# Patient Record
Sex: Female | Born: 1970 | Race: White | Hispanic: No | Marital: Married | Smoking: Never smoker
Health system: Southern US, Community
[De-identification: ages and names within clinical notes are randomized; demographics above are authoritative.]

## PROBLEM LIST (undated history)

## (undated) DIAGNOSIS — H9190 Unspecified hearing loss, unspecified ear: Secondary | ICD-10-CM

## (undated) DIAGNOSIS — Z872 Personal history of diseases of the skin and subcutaneous tissue: Secondary | ICD-10-CM

## (undated) DIAGNOSIS — Z87442 Personal history of urinary calculi: Secondary | ICD-10-CM

## (undated) DIAGNOSIS — Z8489 Family history of other specified conditions: Secondary | ICD-10-CM

## (undated) DIAGNOSIS — D649 Anemia, unspecified: Secondary | ICD-10-CM

## (undated) DIAGNOSIS — R51 Headache: Secondary | ICD-10-CM

## (undated) DIAGNOSIS — Z973 Presence of spectacles and contact lenses: Secondary | ICD-10-CM

## (undated) DIAGNOSIS — I499 Cardiac arrhythmia, unspecified: Secondary | ICD-10-CM

## (undated) DIAGNOSIS — M26609 Unspecified temporomandibular joint disorder, unspecified side: Secondary | ICD-10-CM

## (undated) DIAGNOSIS — Z8742 Personal history of other diseases of the female genital tract: Secondary | ICD-10-CM

## (undated) DIAGNOSIS — Z98811 Dental restoration status: Secondary | ICD-10-CM

## (undated) HISTORY — PX: APPENDECTOMY: SHX54

## (undated) HISTORY — PX: CYSTOSCOPY WITH RETROGRADE PYELOGRAM, URETEROSCOPY AND STENT PLACEMENT: SHX5789

## (undated) HISTORY — PX: CYSTOSCOPY W/ URETERAL STENT REMOVAL: SHX1430

## (undated) HISTORY — PX: WISDOM TOOTH EXTRACTION: SHX21

## (undated) HISTORY — PX: COLONOSCOPY: SHX174

---

## 1998-06-02 ENCOUNTER — Other Ambulatory Visit: Admission: RE | Admit: 1998-06-02 | Discharge: 1998-06-02 | Payer: Self-pay | Admitting: Obstetrics and Gynecology

## 1999-06-25 ENCOUNTER — Other Ambulatory Visit: Admission: RE | Admit: 1999-06-25 | Discharge: 1999-06-25 | Payer: Self-pay | Admitting: Obstetrics and Gynecology

## 2000-05-21 ENCOUNTER — Inpatient Hospital Stay (HOSPITAL_COMMUNITY): Admission: AD | Admit: 2000-05-21 | Discharge: 2000-05-21 | Payer: Self-pay | Admitting: Obstetrics and Gynecology

## 2000-05-22 ENCOUNTER — Inpatient Hospital Stay (HOSPITAL_COMMUNITY): Admission: AD | Admit: 2000-05-22 | Discharge: 2000-05-22 | Payer: Self-pay | Admitting: Obstetrics and Gynecology

## 2000-06-05 ENCOUNTER — Observation Stay (HOSPITAL_COMMUNITY): Admission: AD | Admit: 2000-06-05 | Discharge: 2000-06-06 | Payer: Self-pay | Admitting: Obstetrics and Gynecology

## 2000-06-05 ENCOUNTER — Encounter: Payer: Self-pay | Admitting: Obstetrics and Gynecology

## 2000-06-24 ENCOUNTER — Inpatient Hospital Stay (HOSPITAL_COMMUNITY): Admission: AD | Admit: 2000-06-24 | Discharge: 2000-06-24 | Payer: Self-pay | Admitting: Obstetrics and Gynecology

## 2000-06-29 ENCOUNTER — Inpatient Hospital Stay (HOSPITAL_COMMUNITY): Admission: AD | Admit: 2000-06-29 | Discharge: 2000-07-02 | Payer: Self-pay | Admitting: Obstetrics and Gynecology

## 2000-06-30 ENCOUNTER — Encounter: Payer: Self-pay | Admitting: Obstetrics and Gynecology

## 2000-07-05 ENCOUNTER — Ambulatory Visit (HOSPITAL_COMMUNITY): Admission: RE | Admit: 2000-07-05 | Discharge: 2000-07-05 | Payer: Self-pay | Admitting: Obstetrics and Gynecology

## 2000-07-07 ENCOUNTER — Inpatient Hospital Stay (HOSPITAL_COMMUNITY): Admission: AD | Admit: 2000-07-07 | Discharge: 2000-07-11 | Payer: Self-pay | Admitting: Obstetrics and Gynecology

## 2000-07-12 ENCOUNTER — Encounter: Admission: RE | Admit: 2000-07-12 | Discharge: 2000-08-11 | Payer: Self-pay | Admitting: Obstetrics and Gynecology

## 2000-08-09 ENCOUNTER — Other Ambulatory Visit: Admission: RE | Admit: 2000-08-09 | Discharge: 2000-08-09 | Payer: Self-pay | Admitting: Obstetrics and Gynecology

## 2000-10-24 ENCOUNTER — Encounter: Payer: Self-pay | Admitting: *Deleted

## 2000-10-24 ENCOUNTER — Ambulatory Visit (HOSPITAL_COMMUNITY): Admission: RE | Admit: 2000-10-24 | Discharge: 2000-10-24 | Payer: Self-pay | Admitting: *Deleted

## 2000-11-28 ENCOUNTER — Ambulatory Visit (HOSPITAL_COMMUNITY): Admission: RE | Admit: 2000-11-28 | Discharge: 2000-11-28 | Payer: Self-pay | Admitting: *Deleted

## 2000-11-28 ENCOUNTER — Encounter: Payer: Self-pay | Admitting: *Deleted

## 2001-02-05 ENCOUNTER — Inpatient Hospital Stay (HOSPITAL_COMMUNITY): Admission: AD | Admit: 2001-02-05 | Discharge: 2001-02-05 | Payer: Self-pay | Admitting: Obstetrics and Gynecology

## 2001-02-05 ENCOUNTER — Encounter: Payer: Self-pay | Admitting: Obstetrics and Gynecology

## 2001-09-07 ENCOUNTER — Other Ambulatory Visit: Admission: RE | Admit: 2001-09-07 | Discharge: 2001-09-07 | Payer: Self-pay | Admitting: Obstetrics and Gynecology

## 2002-10-31 ENCOUNTER — Other Ambulatory Visit: Admission: RE | Admit: 2002-10-31 | Discharge: 2002-10-31 | Payer: Self-pay | Admitting: Obstetrics and Gynecology

## 2003-12-18 ENCOUNTER — Other Ambulatory Visit: Admission: RE | Admit: 2003-12-18 | Discharge: 2003-12-18 | Payer: Self-pay | Admitting: Obstetrics and Gynecology

## 2005-01-21 ENCOUNTER — Other Ambulatory Visit: Admission: RE | Admit: 2005-01-21 | Discharge: 2005-01-21 | Payer: Self-pay | Admitting: Obstetrics and Gynecology

## 2005-06-16 ENCOUNTER — Encounter: Admission: RE | Admit: 2005-06-16 | Discharge: 2005-06-16 | Payer: Self-pay | Admitting: Gastroenterology

## 2006-10-05 ENCOUNTER — Ambulatory Visit (HOSPITAL_COMMUNITY): Admission: RE | Admit: 2006-10-05 | Discharge: 2006-10-05 | Payer: Self-pay | Admitting: Obstetrics and Gynecology

## 2006-11-02 ENCOUNTER — Inpatient Hospital Stay (HOSPITAL_COMMUNITY): Admission: RE | Admit: 2006-11-02 | Discharge: 2006-11-06 | Payer: Self-pay | Admitting: Obstetrics and Gynecology

## 2009-04-24 ENCOUNTER — Ambulatory Visit (HOSPITAL_COMMUNITY): Admission: RE | Admit: 2009-04-24 | Discharge: 2009-04-24 | Payer: Self-pay | Admitting: Obstetrics and Gynecology

## 2010-03-31 ENCOUNTER — Encounter
Admission: RE | Admit: 2010-03-31 | Discharge: 2010-03-31 | Payer: Self-pay | Source: Home / Self Care | Attending: Otolaryngology | Admitting: Otolaryngology

## 2010-08-21 NOTE — H&P (Signed)
NAME:  Terri Mora, Terri Mora NO.:  [id number]   MEDICAL RECORD NO.:  [id number]          PATIENT TYPE:  INP   LOCATION:  9107                          FACILITY:  [facility]   PHYSICIAN:  Juluis Mire, M.D.   DATE OF BIRTH:  07-23-1970   DATE OF ADMISSION:  11/02/2006  DATE OF DISCHARGE:                              HISTORY & PHYSICAL   The patient is a 40 year old gravida 4, para 2, abortus 1 female whose  last menstrual period was February 13, 2006 gives her estimated date of  confinement of August 19 and estimated gestational age of approximately  37 weeks.  The patient's prenatal course has been complicated by two  prior low transverse cesarean sections.  She is for repeat cesarean  section.  She has had elevated mean arterial pressures for the past  several weeks.  We have been watching her closely. We did amniocentesis  yesterday revealed an LS of 2.8:1 with phosphatidylglycerol.  In view of  this we will proceed with the repeat cesarean section at the present  time.   She is also risk for advanced maternal age.  She declined any type of  genetic evaluation stating she would not terminate the pregnancy.   The patient's pregnancy was also complicated by preterm uterine  activity.  She was using Procardia as needed.  We will watch her closely  with fetal fibronectin and cervical checks.   She also has a history of elevated liver function tests. She had a  complete work up at [facility] with negative findings, she has a mildly  elevated SGOT that is consistent issue.   The patient's baby also developed PAC's.  She had complete screen by the  maternal fetal medicine department at [facility], cardiac  evaluation was normal. At that time there were no PAC is noted.   Again she had amniocentesis performed yesterday with mature studies.  That time, biophysical profiles 8/8 and she had a reactive nonstress  test.   ALLERGIES:  She is allergic to IVP DYE.   MEDICATIONS:  Include prenatal vitamins and Procardia.   PAST MEDICAL HISTORY.:   FAMILY HISTORY AND SOCIAL HISTORY:  Please see prenatal records.   REVIEW OF SYSTEMS:  Noncontributory.   PHYSICAL EXAMINATION:  VITAL SIGNS:  The patient is afebrile with stable  vital signs.  HEENT: The patient normocephalic.  Pupils equal and reactive to light  and accommodation.  Extraocular movements were intact.  Sclerae and  conjunctivae are clear.  Oropharynx clear, neck without thyromegaly.  BREASTS:  Not examined.  LUNGS:  Clear.  HEART:  Regular rhythm and rate with a grade 2/6 systolic ejection  murmur.  No clicks or gallops.  ABDOMEN:  Gravid uterus consistent with dates.  PELVIC: Exam deferred.  EXTREMITIES:  Trace edema.  NEUROLOGY:  Grossly within normal limits.  Deep tendon reflexes 2+ and  no clonus.   IMPRESSION:  1. Intrauterine pregnancy at 37 weeks with mature fetal studies.  2. Mild pregnancy-induced hypertension.  3. Chronically elevated SGOT.  4. Infant with occasional episodes of PAC's.   PLAN:  The patient will undergo repeat  cesarean section.  The risks have  been explained including the risk of infection.  The risk of hemorrhage  that could require transfusion with the risk of AIDS or hepatitis.  The  risk of injury to adjacent organs including bladder, bowel, ureters.  The risk of deep venous thrombosis and pulmonary embolus.  The patient  expressed understanding of indications and risks.      Juluis Mire, M.D.  Electronically Signed     JSM/MEDQ  D:  11/02/2006  T:  11/02/2006  Job:  [id number]

## 2010-08-21 NOTE — Discharge Summary (Signed)
Terri Mora, Terri Mora           ACCOUNT NO.:  [id number]   MEDICAL RECORD NO.:  [id number]          PATIENT TYPE:  INP   LOCATION:  9107                          [facility]ACILITY:  [facility]   PHYSICIAN:  Michelle L. Grewal, M.D.DATE OF BIRTH:  [birth year]   DATE OF ADMISSION:  11/02/2006  DATE OF DISCHARGE:  11/06/2006                               DISCHARGE SUMMARY   ADMITTING DIAGNOSIS:  1. Intrauterine pregnancy at 37 weeks estimated gestational age.  2. Mild pregnancy-induced hypertension.  3. Chronically elevated SGOT.  4. Previous cesarean section, desires repeat.   DISCHARGE DIAGNOSIS:  1. Status post low transverse cesarean section.  2. Viable female infant.   PROCEDURE:  Repeat low transverse cesarean section   REASON FOR ADMISSION:  Please see dictated H&P.   HOSPITAL COURSE:  The patient was a 40 year old gravida 4, para 2,  abortus 1 that was admitted to [facility] for scheduled  cesarean section.  The patient was noted to have some elevated mean  arterial pressures over the past several weeks and she had been observed  closely.  The patient did undergo amniocentesis which had revealed a  mature LS ratio of 2.8:1 with PG.  The patient also had a history of  elevated liver function test.  She had undergone a complete workup at  [facility] with negative findings.  This was of mild elevation in SGOT  that was persistent.  On the morning of admission the patient was taken  to the operating room where spinal anesthesia was administered without  difficulty.  A low transverse incision was made with delivery of a  viable female infant weighing 7 pounds 3 ounces, Apgars of 8 at 1 minute  and 9 at 5 minutes.  The patient tolerated procedure well and was taken  to the recovery room in stable condition.  On postoperative day #1 the  patient was without complaint.  Vital signs were stable.  She is  afebrile.  Abdomen soft with good return of bowel function.  Fundus is  firm  and nontender.  Abdominal dressing had been reinforced.  Dressing  was partially removed and no active bleeding was noted.  Foley was noted  to have adequate urine output.  Laboratory findings revealed hemoglobin  of 10.1, platelet count 175,000, WBC count of 10.3.  Blood type was  noted to be A+.  On postoperative day #2 the patient complained of some  flatus.  Vital signs were stable.  She is afebrile.  Abdomen soft.  Fundus firm and nontender.  Incision was clean, dry and intact.  She was  ambulating well.  On postoperative day #3 the patient was without  complaint.  Vital signs were stable.  She is afebrile.  Fundus firm and  nontender.  Incision was clean, dry and intact.  The patient desired to  stay one additional day.  On postoperative day #4 the patient was  without complaint.  Vital signs stable.  She is afebrile.  Fundus firm  and nontender.  Incision was clean, dry and intact.  Staples were  removed and the patient was later discharged home.   CONDITION  ON DISCHARGE:  Good.   DIET:  Regular as tolerated.   ACTIVITY:  No heavy lifting, no driving x2 weeks.  No vaginal entry.   FOLLOW UP:  Patient to follow up in the office in 1-2 weeks for incision  check.  She is to call for temperature greater than 100 degrees,  persistent nausea, vomiting, heavy vaginal bleeding and/or redness or  drainage from the incisional site.   DISCHARGE MEDICATION:  1. Tylox #30 one p.o. every 4 to 6 hours.  2. Motrin 600 mg every 6 hours.  3. Prenatal vitamins one p.o. daily.  4. Colace one p.o. daily p.r.n.      Julio Sicks, N.P.      Terri Mora, M.D.  Electronically Signed    CC/MEDQ  D:  12/07/2006  T:  12/07/2006  Job:  [id number]

## 2010-08-21 NOTE — H&P (Signed)
[facility]  Patient:    Terri Mora, Terri Mora                  MRN: [id number] Adm. Date:  [id number] Disc. Date: [id number] Attending:  Trevor Iha                         History and Physical  HISTORY OF PRESENT ILLNESS:   The patient is a 40 year old gravida 2 para 0-1-0-1 married white female, last menstrual period of July 21 giving her an estimated date of confinement of May 1, estimated gestational age of 36+ weeks.  This is consistent with initial exam done early in the pregnancy and prior early ultrasound.  She presents now for repeat cesarean section.  Patients first pregnancy ended up with a low transverse cesarean section for questionable fetal abruption.  She was offered the option of trial of labor, which she has declined.  Her pregnancy was also complicated by preterm labor for which she has been on terbutaline and required prior hospitalization. Because of continued uterine activity and discomfort, we did an amniocentesis on her on April 2.  L/S was 3.3:1 with PG. She decided to proceed with repeat cesarean section and in view of the mature pulmonary status presents at the present time for this.  Again, the trial of labor has been offered.  ALLERGIES:                    IVP DYE.  MEDICATIONS:                  Prenatal vitamins.  She had been on terbutaline.  PRENATAL LABORATORY:          Patient is A positive, negative antibody screen. Nonreactive serology.  Equivocal rubella.  Negative hepatitis B surface antigen.  GC and chlamydia were both negative.  Her plasma 50 g Glucola was 93, which was normal.  PAST MEDICAL HISTORY, FAMILY HISTORY, SOCIAL HISTORY:      Please see prenatal records.  REVIEW OF SYSTEMS:            Noncontributory.  PHYSICAL EXAMINATION:  VITAL SIGNS:                  Patient is afebrile with stable vital signs.  HEENT:                        Patient is normocephalic.  Pupils equal, round, and  reactive to light and accommodation.  Extraocular movements are intact. Sclerae and conjunctivae were clear.  Oropharynx clear.  NECK:                         Without thyromegaly.  LUNGS:                        Clear.  CARDIOVASCULAR:               Regular rhythm and rate with a grade 2/6 systolic ejection murmur.  No clicks or gallops.  ABDOMEN:                      Confirms a gravid uterus consistent with dates.  PELVIC:                       Not performed.  EXTREMITIES:  Trace edema.  NEUROLOGIC:                   Grossly within normal limits.  BASIC IMPRESSION:             1. Intrauterine pregnancy at 36+ weeks with                                  mature fetal pulmonary studies.                               2. Prior cesarean section, desirous of repeat.  PLAN:                         The patient to undergo repeat cesarean section. The risks have been discussed, including the risk of anesthesia; the risk of infection; the risk of hemorrhage that could necessitate transfusion with the risk of AIDS or hepatitis; the risk of injury to adjacent organs including bladder, bowel, or ureters that could require further exploratory surgery; the risk of deep venous thrombosis and pulmonary embolus. DD:  07/07/00 TD:  07/07/00 Job: [id number]

## 2010-08-21 NOTE — Op Note (Signed)
[facility]  Patient:    Terri Mora, Terri Mora                  MRN: [id number] Proc. Date: 07/07/00 Adm. Date:  [id number] Disc. Date: [id number] Attending:  Trevor Iha                           Operative Report  PREOPERATIVE DIAGNOSIS:       Intrauterine pregnancy at 36+ weeks with prior cesarean section, desires repeat, amniocentesis and mature fetal lung studies.  POSTOPERATIVE DIAGNOSIS:      Intrauterine pregnancy at 36+ weeks with prior cesarean section, desires repeat, amniocentesis and mature fetal lung studies.  OPERATION:                    Repeat low transverse cesarean section.  SURGEON:                      Juluis Mire, M.D.  ANESTHESIA:                   Spinal  ESTIMATED BLOOD LOSS:         800 cc.  PACKS AND DRAINS:             None.  INTRAOPERATIVE BLOOD REPLACED: None.  COMPLICATIONS:                None.  INDICATIONS:                  As dictated in History & Physical. DESCRIPTION OF PROCEDURE:     The patient was taken to the operating room and placed in supine position with left lateral tilt.  After acceptable level of epidural anesthesia was obtained, the abdomen was prepped with Betadine and draped in sterile field.  The prior low transverse skin incision was excised. The incision was then extended through subcutaneous tissue.  The anterior rectus fascia was entered sharply, and the incision in the fascia was extended laterally.  The fascia was taken off the muscle superiorly and inferiorly using both blunt and sharp dissection.  Rectus muscles were separated in the midline.  The anterior peritoneum was entered sharply, and the incision in the peritoneum was extended both superiorly and inferiorly.   No adhesive process was noted.  Low transverse bladder flap was developed.  Low transverse uterine incision was begun with knife and extended laterally using manual traction. The infant was in the vertex  presentation and was delivered with elevation of head and fundal pressure.  Two nuchal cords were noted and reduced.  The infant was a viable female who weighted 6 pounds 6 ounces.  Apgars were 8/9, and umbilical artery pH was 7.35.  The placenta was then removed. The uterus was wiped free of remaining membranes and placenta.  The uterus was closed with interlocking sutures of 0 chromic using a two-layer closure technique. Tubes and ovaries were visualized and noted to be unremarkable.   Urine output remained clear and adequate.   Pelvic cavity was irrigated.  Hemostasis was excellent.  At this point in time, the muscles were reapproximated with running suture of 3-0 Vicryl.  Fascia was closed with running suture of 0 Panacryl.   Skin was closed with staples and Steri-Strips.  Sponge, instrument, and needle count was reported as correct by circulating nurse x 2. Foley catheter remained clear at time of closure.  The patient was  returned to the recovery room in good condition. DD:  07/07/00 TD:  07/07/00 Job: [id number]

## 2010-08-21 NOTE — Discharge Summary (Signed)
[facility]  Patient:    Terri Mora, Terri Mora                  MRN: [id number] Adm. Date:  [id number] Disc. Date: [id number] Attending:  Frederich Balding Dictator:   Danie Chandler, R.N.                           Discharge Summary  ADMITTING DIAGNOSES:          1. Intrauterine pregnancy at 36+ weeks with                                  prior cesarean section, desires repeat.                               2. Amniocentesis and mature fetal lung studies.  DISCHARGE DIAGNOSES:          1. Intrauterine pregnancy at 36+ weeks with                                  prior cesarean section, desires repeat.                               2. Amniocentesis and mature fetal lung studies.  PROCEDURE:                    On July 07, 2000 repeat low transverse cesarean section.  REASON FOR ADMISSION:         Please see dictated H&P.  HOSPITAL COURSE:              The patient was taken to the operating room and underwent the above named procedure without complication.  This was productive of a viable female infant with Apgars of 8 at one minute and 9 at five minutes and an arterial cord pH of 7.35.  Postoperatively on day #1 the patients hemoglobin was 10.7, hematocrit 29.8, and white blood cell count 9.8.  The patient had a good return of bowel function on this day and good control of pain.  On postoperative day #2 the patient was complaining of some right shoulder pain.  The patients liver function tests were checked on this day and they did show an SGOT of 80.  Other laboratories were within normal limits.  She was still complaining of the right shoulder pain and this was felt to be musculoskeletal.  She had [facility] laboratories rechecked the following morning.  On postoperative day #3 the patient denied headache, visual change. She was having diffuse upper abdominal discomfort.  The shoulder pain was improving.  She was ambulating well without difficulty and  tolerating a regular diet.  SGOT was still elevated at 95 and all other laboratories were within normal limits.  She was discharged home on postoperative day #4.  The right shoulder pain had improved.  The SGOT was decreasing.  She was to follow-up in one week to have that repeated.  CONDITION ON DISCHARGE:       Good.  DIET:                         Regular, as tolerated.  ACTIVITY:                     No heavy lifting, no driving, no vaginal entry.  FOLLOW-UP:                    Follow-up in the office in one week for a repeat liver function test.  She is to call for temperature greater than 100 degrees, persistent nausea or vomiting, heavy vaginal bleeding, and/or redness or drainage from the incision site as well as any PIH signs or symptoms.  DISCHARGE MEDICATIONS:        1. Prenatal vitamins one p.o. q.d.                               2. Tylox one to two p.o. q.4h. p.r.n. pain.                               3. Motrin 600 mg one p.o. q.6h. p.r.n. pain. DD:  07/27/00 TD:  07/27/00 Job: [id number]

## 2010-08-21 NOTE — Op Note (Signed)
NAME:  Terri Mora, POYNTER NO.:  [id number]   MEDICAL RECORD NO.:  [id number]          PATIENT TYPE:  INP   LOCATION:  9107                          FACILITY:  [facility]   PHYSICIAN:  Juluis Mire, M.D.   DATE OF BIRTH:  Feb 27, 1971   DATE OF PROCEDURE:  11/02/2006  DATE OF DISCHARGE:                               OPERATIVE REPORT   PREOPERATIVE DIAGNOSIS:  Intrauterine pregnancy at 37 weeks with mild  pregnancy-induced hypertension, repeat cesarean section.  Amniocentesis  with mature fetal pulmonary studies.   POSTOPERATIVE DIAGNOSIS:  Intrauterine pregnancy at 37 weeks with mild  pregnancy-induced hypertension, repeat cesarean section.  Amniocentesis  with mature fetal pulmonary studies.   OPERATIVE PROCEDURE:  Low transverse cesarean section.   SURGEON:  Juluis Mire, M.D.   ANESTHESIA:  Spinal.   ESTIMATED BLOOD LOSS:  500-700 mL.   PACKS DRAINS:  None.   INTRAOPERATIVE BLOOD REPLACED:  None.   COMPLICATIONS:  None.   INDICATIONS:  Dictated in history and physical.   PROCEDURE:  Patient taken to OR, placed supine position with left  lateral tilt.  After satisfactory level spinal anesthesia was obtained,  the abdomen was prepped out with Betadine and draped as sterile field.  Prior low transverse skin incision was identified and excised and  incision was extended through subcutaneous tissue.  Fascia was entered  sharply, incision in fascia extended laterally.  Fascia taken off the  muscle superiorly and inferiorly.  Rectus muscles were separated  midline.  The peritoneum was entered sharply with incision in peritoneum  extended both superiorly and inferiorly.  Low transverse bladder flap  was developed, low transverse uterine incision begun with a knife  extended laterally using manual traction.  Amniotic fluid was clear.  The infant presented in vertex presentation, delivered with elevation of  head and fundal pressure.  The infant was a viable female  who weighed 7  pounds 3 ounces.  Apgars were 8/9.  Placenta was delivered manually.  Uterus was exteriorized for closure.  Uterus then closed with running  locking suture of 0-0 chromic using a two-layer closure technique.  We  did have some oozing from the left side of the incision brought under  control with figure-of-eights of 0-0 chromic.  This point we had good  hemostasis and clear urine output.  The uterus was returned to the  abdominal cavity.  We thoroughly irrigated the pelvis.  Again had good  hemostasis, clear urine output.  Muscle and peritoneum closed with a  running  suture of 3-0 Vicryl.  Fascia closed with a running suture of 0-0 PDS.  Skin was closed with staples and Steri-Strips.  Sponge, instrument,  needle count was reported as correct by circulating nurse x2.  Foley  catheter remained clear at time of closure.  The patient tolerated  procedure well, was returned to recovery room in good condition.      Juluis Mire, M.D.  Electronically Signed     JSM/MEDQ  D:  11/02/2006  T:  11/03/2006  Job:  [id number]

## 2010-08-21 NOTE — Discharge Summary (Signed)
[facility]  Patient:    Terri Mora, Terri Mora                  MRN: [id number] Adm. Date:  [id number] Disc. Date: [id number] Attending:  Trevor Iha                           Discharge Summary  ADMITTING DIAGNOSES:          1. Intrauterine pregnancy at 35-1/7 weeks.                               2. Preterm labor.                               3. History of low transverse cesarean section,                                  desires repeat.  DISCHARGE DIAGNOSES:          1. Intrauterine pregnancy at 35-1/7 weeks.                               2. Preterm labor.                               3. History of low transverse cesarean section,                                  desires repeat.  REASON FOR ADMISSION:         This patient is a 40 year old, married white female, G2, P47, with an EDC of Aug 03, 2000. Prenatal care is complicated by history of placental abruption with a subsequent low transverse cesarean section at 36 weeks with her first pregnancy. She is positive for group B beta strep and has a history of kidney stones. The patient has been on terbutaline for preterm labor management. She received magnesium sulfate tocolysis approximately three and a half weeks prior to this admission. She presented to triage with uterine contractions. Cervix was closed and thick. Subcutaneous terbutaline did not slow her contractions. The patient requested magnesium sulfate tocolysis.  HOSPITAL COURSE:              The patient is afebrile with stable vital signs. Cervix is closed, thick, high, moderately soft. She started on magnesium sulfate tocolysis and is raised to 4 g an hour to achieve complete tocolysis. She is also on intravenous Unasyn. Ultrasound on March 28  reveals a vertex singleton pregnancy with an anterior grade 2 placenta and an AFI of 16.9. Estimated fetal weight is approximately 2600 to 2700 grams which is approximately 58th percentile at 35  weeks. After going to 4 g of magnesium an hour, it is slowly decreased. At one point on June 30, 2000, the nurse noted the patient to have decreased lung sounds in one lung field. However, upon reevaluation, this had cleared both to my examination and to the original nurses assessment. It was felt to be atelectasis which had cleared with deep breathing. Oxygen saturations were 99% on room air and the patients  vital signs were otherwise stable. The patient was then weaned from the magnesium and converted to oral terbutaline. Contractions have persisted to be approximately one to six per hour. Options of management have been discussed with the patient and her husband at length on multiple occasions during this admission. Amniocentesis for fetal lung maturity evaluation was discussed and the patient does not want this at this time. However, in view of the patients stable condition, will now discharge home.  CONDITION ON DISCHARGE:       Good.  DIET:                         Regular as tolerated.  ACTIVITY:                     Modified bed rest and pelvic rest.  DISCHARGE MEDICATIONS:        1. Ceftin 250 mg one p.o. b.i.d. for seven days.                               2. Terbutaline 5 mg p.o. q.4h.                               3. Multivitamins daily.  DISCHARGE FOLLOWUP:           The patient will follow up in the office within one week.  DISCHARGE INSTRUCTIONS:       She is to call for problems including, but not limited to, hard contractions, ruptured membranes, vaginal bleeding, or decreased fetal movement or increasing pain. DD:  07/02/00 TD:  07/03/00 Job: [id number]

## 2011-01-18 LAB — RAPID HIV SCREEN (WH-MAU): Rapid HIV Screen: NONREACTIVE

## 2011-01-18 LAB — CBC
HCT: 33.3 — ABNORMAL LOW
Hemoglobin: 11.3 — ABNORMAL LOW
MCHC: 34
Platelets: 205
RBC: 3.47 — ABNORMAL LOW
WBC: 8.6

## 2011-02-19 ENCOUNTER — Encounter (HOSPITAL_COMMUNITY): Payer: Self-pay

## 2011-02-28 NOTE — H&P (Signed)
NAME:  Terri Mora, Terri Mora NO.:  [id number]  MEDICAL RECORD NO.:  [id number]  LOCATION:  [facility]                         FACILITY:  [facility]  PHYSICIAN:  Juluis Mire, M.D.   DATE OF BIRTH:  1971-01-30  DATE OF ADMISSION:  12/31/2010 DATE OF DISCHARGE:                             HISTORY & PHYSICAL   DATE OF SURGERY:  March 05, 2011, at [facility].  The patient is a 40 year old gravida 3, para 3, married female, presents for diagnostic laparoscopy for evaluation of chronic pelvic pain.  The patient has been having trouble with persistent right lower quadrant pain.  Ultrasound evaluation has been unremarkable.  She has had a complete GI workup, all of which have been negative.  Presumptively, we will be dealing with some form of endometriosis or adenomyosis.  She has been on birth control pills for management of menorrhagia.  This having trouble with some breakthrough bleeding.  We had discussed various options, including attempt of further hormonal suppression versus laparoscopy versus hysterectomy.  She now presents for laparoscopy.  ALLERGIES:  She has seasonal allergies, allergic to IVP DYE.  MEDICATIONS:  She is on birth control pills.  PAST MEDICAL HISTORY:  Usual childhood diseases.  No significant sequelae.  Does have a history of a kidney stone in 1993, had a stent placed at that time, subsequently has been removed.  PAST SURGICAL HISTORY:  She has had 3 cesarean sections.  Also had her appendix removed.  SOCIAL HISTORY:  Reveals no tobacco or alcohol use.  FAMILY HISTORY:  Basically noncontributory.  REVIEW OF SYSTEMS:  Noncontributory.  PHYSICAL EXAMINATION:  VITAL SIGNS:  The patient is afebrile with stable vital signs. HEENT:  The patient is normocephalic.  Pupils equal, round, reactive to light and accommodation.  Extraocular movements are intact.  Sclerae and conjunctivae are clear.  Oropharynx clear. NECK:  Without  thyromegaly. BREASTS:  Not examined. LUNGS:  Clear. CARDIAC:  Regular rhythm and rate.  There are no murmurs or gallops. ABDOMEN:  Her abdominal exam is benign.  No mass, organomegaly, or tenderness. Pelvic:  Normal external genitalia.  Vaginal mucosa is clear.  Cervix unremarkable.  Uterus normal size, shape, and contour.  Adnexa free of masses or tenderness. EXTREMITIES:  Trace edema. NEUROLOGIC:  Grossly within normal limits.  IMPRESSION: 1. Chronic pelvic pain. 2. Menorrhagia.  PLAN:  The patient will undergo diagnostic laparoscopy.  The risks of surgery have been discussed including the risk of infection.  The risk of hemorrhage that could require transfusion with the risk of AIDS or hepatitis.  Risk of injury to adjacent organs including bladder, bowel that could require further exploratory surgery.  Risk of deep venous thrombosis and pulmonary embolus.  The patient does understand the indications, options, and risks and benefits.     Juluis Mire, M.D.     JSM/MEDQ  D:  02/28/2011  T:  02/28/2011  Job:  [id number]

## 2011-02-28 NOTE — H&P (Signed)
  Patient name  Terri Mora DICTATION#  [id number] CSN# [id number]  Juluis Mire, MD 02/28/2011 9:21 AM

## 2011-03-01 ENCOUNTER — Encounter (HOSPITAL_COMMUNITY)
Admission: RE | Admit: 2011-03-01 | Discharge: 2011-03-01 | Disposition: A | Discharge status: Home / Self Care | Payer: PRIVATE HEALTH INSURANCE | Source: Ambulatory Visit | Attending: Obstetrics and Gynecology | Admitting: Obstetrics and Gynecology

## 2011-03-01 ENCOUNTER — Encounter (HOSPITAL_COMMUNITY): Payer: Self-pay

## 2011-03-01 HISTORY — DX: Cardiac arrhythmia, unspecified: I49.9

## 2011-03-01 LAB — CBC
HCT: 37.1 % (ref 36.0–46.0)
Hemoglobin: 12.9 g/dL (ref 12.0–15.0)
MCH: 30.1 pg (ref 26.0–34.0)
MCHC: 34.8 g/dL (ref 30.0–36.0)
MCV: 86.7 fL (ref 78.0–100.0)
Platelets: 299 10*3/uL (ref 150–400)
RBC: 4.28 MIL/uL (ref 3.87–5.11)
WBC: 7.1 10*3/uL (ref 4.0–10.5)

## 2011-03-01 LAB — SURGICAL PCR SCREEN: Staphylococcus aureus: NEGATIVE

## 2011-03-01 NOTE — Patient Instructions (Signed)
   Your procedure is scheduled ZO:XWRUEA November 30th  Enter through the Main Entrance of [facility] at:6am Pick up the phone at the desk and dial [phone] and inform us of your arrival.  Please call this number if you have any problems the morning of surgery: [phone]  Remember: Do not eat food after midnight:Thursday Do not drink clear liquids after: midnight Thursday Take these medicines the morning of surgery with a SIP OF WATER:  Do not wear jewelry, make-up, or FINGER nail polish Do not wear lotions, powders, or perfumes.  You may not  wear deodorant. Do not shave 48 hours prior to surgery. Do not bring valuables to the hospital.    Patients discharged on the day of surgery will not be allowed to drive home.     Remember to use your hibiclens as instructed.Please shower with 1/2 bottle the evening before your surgery and the other 1/2 bottle the morning of surgery.

## 2011-03-05 ENCOUNTER — Encounter (HOSPITAL_COMMUNITY)
Admission: RE | Disposition: A | Discharge status: Home / Self Care | Payer: Self-pay | Source: Ambulatory Visit | Attending: Obstetrics and Gynecology

## 2011-03-05 ENCOUNTER — Encounter (HOSPITAL_COMMUNITY): Payer: Self-pay

## 2011-03-05 ENCOUNTER — Encounter (HOSPITAL_COMMUNITY): Payer: Self-pay | Admitting: *Deleted

## 2011-03-05 ENCOUNTER — Ambulatory Visit (HOSPITAL_COMMUNITY)
Admission: RE | Admit: 2011-03-05 | Discharge: 2011-03-05 | Disposition: A | Discharge status: Home / Self Care | Payer: PRIVATE HEALTH INSURANCE | Source: Ambulatory Visit | Attending: Obstetrics and Gynecology | Admitting: Obstetrics and Gynecology

## 2011-03-05 ENCOUNTER — Ambulatory Visit (HOSPITAL_COMMUNITY): Payer: PRIVATE HEALTH INSURANCE

## 2011-03-05 DIAGNOSIS — N920 Excessive and frequent menstruation with regular cycle: Secondary | ICD-10-CM | POA: Insufficient documentation

## 2011-03-05 DIAGNOSIS — N803 Endometriosis of pelvic peritoneum, unspecified: Secondary | ICD-10-CM | POA: Insufficient documentation

## 2011-03-05 DIAGNOSIS — N736 Female pelvic peritoneal adhesions (postinfective): Secondary | ICD-10-CM

## 2011-03-05 DIAGNOSIS — N949 Unspecified condition associated with female genital organs and menstrual cycle: Secondary | ICD-10-CM | POA: Insufficient documentation

## 2011-03-05 DIAGNOSIS — Z01818 Encounter for other preprocedural examination: Secondary | ICD-10-CM | POA: Insufficient documentation

## 2011-03-05 DIAGNOSIS — Z01812 Encounter for preprocedural laboratory examination: Secondary | ICD-10-CM | POA: Insufficient documentation

## 2011-03-05 HISTORY — PX: LAPAROSCOPY: SHX197

## 2011-03-05 LAB — HCG, SERUM, QUALITATIVE: Preg, Serum: NEGATIVE

## 2011-03-05 SURGERY — LAPAROSCOPY OPERATIVE
Anesthesia: General | Site: Abdomen | Wound class: Clean Contaminated

## 2011-03-05 MED ORDER — PROPOFOL 10 MG/ML IV EMUL
INTRAVENOUS | Status: AC
Start: 2011-03-05 — End: 2011-03-05
  Filled 2011-03-05: qty 20

## 2011-03-05 MED ORDER — LIDOCAINE HCL (CARDIAC) 20 MG/ML IV SOLN
INTRAVENOUS | Status: AC
  Filled 2011-03-05: qty 5

## 2011-03-05 MED ORDER — FENTANYL CITRATE 0.05 MG/ML IJ SOLN
25.0000 ug | INTRAMUSCULAR | Status: DC | PRN
  Administered 2011-03-05 (×2): 50 ug via INTRAVENOUS

## 2011-03-05 MED ORDER — FENTANYL CITRATE 0.05 MG/ML IJ SOLN
INTRAMUSCULAR | Status: AC
  Filled 2011-03-05: qty 5

## 2011-03-05 MED ORDER — BUPIVACAINE HCL (PF) 0.25 % IJ SOLN
INTRAMUSCULAR | Status: DC | PRN
  Administered 2011-03-05: 6 mL

## 2011-03-05 MED ORDER — NEOSTIGMINE METHYLSULFATE 1 MG/ML IJ SOLN
INTRAMUSCULAR | Status: AC
Start: 2011-03-05 — End: 2011-03-05
  Filled 2011-03-05: qty 10

## 2011-03-05 MED ORDER — DEXAMETHASONE SODIUM PHOSPHATE 10 MG/ML IJ SOLN
INTRAMUSCULAR | Status: AC
  Filled 2011-03-05: qty 1

## 2011-03-05 MED ORDER — ROCURONIUM BROMIDE 100 MG/10ML IV SOLN
INTRAVENOUS | Status: DC | PRN
  Administered 2011-03-05: 30 mg via INTRAVENOUS

## 2011-03-05 MED ORDER — LACTATED RINGERS IR SOLN
Status: DC | PRN
  Administered 2011-03-05: 3000 mL

## 2011-03-05 MED ORDER — FENTANYL CITRATE 0.05 MG/ML IJ SOLN
INTRAMUSCULAR | Status: DC | PRN
  Administered 2011-03-05 (×5): 50 ug via INTRAVENOUS

## 2011-03-05 MED ORDER — FENTANYL CITRATE 0.05 MG/ML IJ SOLN
INTRAMUSCULAR | Status: AC
  Administered 2011-03-05: 50 ug via INTRAVENOUS
  Filled 2011-03-05: qty 2

## 2011-03-05 MED ORDER — LACTATED RINGERS IV SOLN
INTRAVENOUS | Status: DC
  Administered 2011-03-05: 125 mL/h via INTRAVENOUS
  Administered 2011-03-05 (×2): via INTRAVENOUS

## 2011-03-05 MED ORDER — DEXAMETHASONE SODIUM PHOSPHATE 4 MG/ML IJ SOLN
INTRAMUSCULAR | Status: DC | PRN
  Administered 2011-03-05: 10 mg via INTRAVENOUS

## 2011-03-05 MED ORDER — GLYCOPYRROLATE 0.2 MG/ML IJ SOLN
INTRAMUSCULAR | Status: DC | PRN
  Administered 2011-03-05: 1 mg via INTRAVENOUS
  Administered 2011-03-05: 0.2 mg via INTRAVENOUS

## 2011-03-05 MED ORDER — ROCURONIUM BROMIDE 50 MG/5ML IV SOLN
INTRAVENOUS | Status: AC
  Filled 2011-03-05: qty 1

## 2011-03-05 MED ORDER — NEOSTIGMINE METHYLSULFATE 1 MG/ML IJ SOLN
INTRAMUSCULAR | Status: DC | PRN
  Administered 2011-03-05: 5 mg via INTRAVENOUS

## 2011-03-05 MED ORDER — MIDAZOLAM HCL 5 MG/5ML IJ SOLN
INTRAMUSCULAR | Status: DC | PRN
  Administered 2011-03-05: 2 mg via INTRAVENOUS

## 2011-03-05 MED ORDER — KETOROLAC TROMETHAMINE 30 MG/ML IJ SOLN
INTRAMUSCULAR | Status: DC | PRN
  Administered 2011-03-05: 30 mg via INTRAVENOUS

## 2011-03-05 MED ORDER — GLYCOPYRROLATE 0.2 MG/ML IJ SOLN
INTRAMUSCULAR | Status: AC
Start: 2011-03-05 — End: 2011-03-05
  Filled 2011-03-05: qty 2

## 2011-03-05 MED ORDER — ONDANSETRON HCL 4 MG/2ML IJ SOLN
INTRAMUSCULAR | Status: DC | PRN
  Administered 2011-03-05: 4 mg via INTRAVENOUS

## 2011-03-05 MED ORDER — KETOROLAC TROMETHAMINE 30 MG/ML IJ SOLN
INTRAMUSCULAR | Status: AC
  Filled 2011-03-05: qty 1

## 2011-03-05 MED ORDER — PROPOFOL 10 MG/ML IV EMUL
INTRAVENOUS | Status: DC | PRN
  Administered 2011-03-05: 180 mg via INTRAVENOUS

## 2011-03-05 MED ORDER — ONDANSETRON HCL 4 MG/2ML IJ SOLN
INTRAMUSCULAR | Status: AC
  Filled 2011-03-05: qty 2

## 2011-03-05 MED ORDER — LIDOCAINE HCL (CARDIAC) 20 MG/ML IV SOLN
INTRAVENOUS | Status: DC | PRN
  Administered 2011-03-05: 40 mg via INTRAVENOUS

## 2011-03-05 MED ORDER — MIDAZOLAM HCL 2 MG/2ML IJ SOLN
INTRAMUSCULAR | Status: AC
  Filled 2011-03-05: qty 2

## 2011-03-05 MED ORDER — OXYCODONE-ACETAMINOPHEN 5-500 MG PO CAPS: 1.0000 | ORAL_CAPSULE | ORAL | Status: AC | PRN

## 2011-03-05 MED ORDER — OXYCODONE-ACETAMINOPHEN 5-500 MG PO CAPS: 1.0000 | ORAL_CAPSULE | ORAL | Status: DC | PRN

## 2011-03-05 MED ORDER — GLYCOPYRROLATE 0.2 MG/ML IJ SOLN
INTRAMUSCULAR | Status: AC
Start: 2011-03-05 — End: 2011-03-05
  Filled 2011-03-05: qty 1

## 2011-03-05 SURGICAL SUPPLY — 26 items
CABLE HIGH FREQUENCY MONO STRZ (ELECTRODE) IMPLANT
CATH ROBINSON RED A/P 16FR (CATHETERS) ×3 IMPLANT
CLOTH BEACON ORANGE TIMEOUT ST (SAFETY) ×3 IMPLANT
DERMABOND ADVANCED (GAUZE/BANDAGES/DRESSINGS) ×1
DERMABOND ADVANCED .7 DNX12 (GAUZE/BANDAGES/DRESSINGS) ×2 IMPLANT
ELECT REM PT RETURN 9FT ADLT (ELECTROSURGICAL) ×3
ELECTRODE REM PT RTRN 9FT ADLT (ELECTROSURGICAL) ×2 IMPLANT
GLOVE BIO SURGEON STRL SZ7 (GLOVE) ×6 IMPLANT
NEEDLE INSUFFLATION 14GA 120MM (NEEDLE) IMPLANT
NS IRRIG 1000ML POUR BTL (IV SOLUTION) IMPLANT
PACK LAPAROSCOPY BASIN (CUSTOM PROCEDURE TRAY) ×3 IMPLANT
POUCH SPECIMEN RETRIEVAL 10MM (ENDOMECHANICALS) IMPLANT
SCISSORS LAP 5X35 DISP (ENDOMECHANICALS) IMPLANT
SCISSORS LAP 5X45 EPIX DISP (ENDOMECHANICALS) IMPLANT
SEALER TISSUE G2 CVD JAW 45CM (ENDOMECHANICALS) IMPLANT
SET IRRIG TUBING LAPAROSCOPIC (IRRIGATION / IRRIGATOR) ×3 IMPLANT
SUT VIC AB 3-0 PS2 18 (SUTURE) ×1
SUT VIC AB 3-0 PS2 18XBRD (SUTURE) ×2 IMPLANT
SUT VICRYL 0 ENDOLOOP (SUTURE) IMPLANT
SUT VICRYL 0 UR6 27IN ABS (SUTURE) ×6 IMPLANT
TOWEL OR 17X24 6PK STRL BLUE (TOWEL DISPOSABLE) ×6 IMPLANT
TROCAR BALLN 12MMX100 BLUNT (TROCAR) ×3 IMPLANT
TROCAR Z-THREAD BLADED 11X100M (TROCAR) IMPLANT
TROCAR Z-THREAD BLADED 5X100MM (TROCAR) IMPLANT
WARMER LAPAROSCOPE (MISCELLANEOUS) ×3 IMPLANT
WATER STERILE IRR 1000ML POUR (IV SOLUTION) IMPLANT

## 2011-03-05 NOTE — OR Nursing (Signed)
Procedure Open laparoscopy, lysis of adhesions, ablation of endometriosis

## 2011-03-05 NOTE — Anesthesia Postprocedure Evaluation (Signed)
Anesthesia Post Note  Patient: Terri Mora [facility]  Procedure(s) Performed:  LAPAROSCOPY OPERATIVE - Ablation of endometriosis, Open laparoscopy; LYSIS OF ADHESION  Anesthesia type: General  Patient location: PACU  Post pain: Pain level controlled  Post assessment: Post-op Vital signs reviewed  Last Vitals:  Filed Vitals:   03/05/11 0824  BP: 121/76  Pulse: 90  Temp: 37 C  Resp: 25    Post vital signs: Reviewed  Level of consciousness: sedated  Complications: No apparent anesthesia complicationsfj

## 2011-03-05 NOTE — Op Note (Signed)
NAME:  Terri Mora, Terri Mora NO.:  [id number]  MEDICAL RECORD NO.:  [id number]  LOCATION:  [facility]                          FACILITY:  [facility]  PHYSICIAN:  Juluis Mire, M.D.   DATE OF BIRTH:  05-07-1970  DATE OF PROCEDURE:  03/05/2011 DATE OF DISCHARGE:                              OPERATIVE REPORT   PREOPERATIVE DIAGNOSIS:  Pelvic pain.  POSTOPERATIVE DIAGNOSES: 1. Pelvic adhesions. 2. Pelvic endometriosis. 3. 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  OPERATIVE PROCEDURES: 1. Open laparoscopy. 2. Lysis of adhesions. 3. Cautery of endometriotic implants.  SURGEON:  Juluis Mire, MD.  ANESTHESIA:  General endotracheal.  ESTIMATED BLOOD LOSS:  Minimal.  PACKS AND DRAINS:  None.  INTRAOPERATIVE BLOOD PLACED:  None.  COMPLICATIONS:  None.  INDICATION:  Per dictated history and physical.  PROCEDURE IN DETAIL:  The patient was taken to the OR and placed in supine position.  After satisfactory level of general endotracheal anesthesia was obtained, the patient was placed in dorsal lithotomy position using the Allen stirrups.  The abdomen, perineum, and vagina were prepped out with an antiseptic solution.  We did not use Betadine as she was allergic to IVP dye.  Bladder was drained with catheterizations.  A Hulka tenaculum was put in place and secured.  The patient was then draped as a sterile field.  Subumbilical area was infiltrated with Marcaine.  Incision was made in the subumbilical area and extended through the subcutaneous tissue.  Fascia was identified, entered sharply, incision was fashioned laterally.  Peritoneum was entered with blunt finger.  Open laparoscopic trocar was put in place and secured.  Abdomen was inflated with carbon dioxide.  Laparoscope was introduced.  There was no evidence of injury to adjacent organs.  A 5-mm trocar was put in place in the suprapubic area under direct visualization.  There were adhesions from the left ovary and left tube to  the left pelvic area.  These were taken down using the scissors.  We used cautery to bring about hemostasis.  Both the left tube and ovary were freed up.  The right tube and ovary were unremarkable.  She had areas of endometriosis in the cul-de-sac.  These were all cauterized and areas on the back of the uterus that were cauterized.  The uterus was extremely boggy, highly suggestive of adenomyosis.  Appendix was surgically absent.  Upper abdomen including liver, tip of the gallbladder were clear.  At the end of the procedure all areas of endometriosis had been cauterized.  Adhesions were taken down.  There was no active bleeding.  No signs of injury to adjacent organs.  The abdomen was deflated of carbon dioxide.  All trocars were removed. Subumbilical fascia was closed figure-of-eight of 0 Vicryl.  The skin was closed in a subcuticular 4-0 Vicryl.  Suprapubic incision was closed with Dermabond.  The Hulka tenaculum was then removed.  The patient was taken out of the dorsal lithotomy position.  Once alert and extubated, transferred to recovery room in good condition.  Sponge, instrument, and needle count reported as correct by circulating nurse.     Juluis Mire, M.D.     JSM/MEDQ  D:  03/05/2011  T:  03/05/2011  Job:  [id number] 

## 2011-03-05 NOTE — Progress Notes (Signed)
Patient ID: GERYL DOHN, female   DOB: [birth year], 40 y.o.   MRN: [id number] Patient name[facility]aron [facility]  DICTATION#[id number] CSN# [id number]  Juluis Mire, MD 03/05/2011 8:12 AM

## 2011-03-05 NOTE — Brief Op Note (Signed)
03/05/2011  8:11 AM  PATIENT:  Terri Mora  40 y.o. female  PRE-OPERATIVE DIAGNOSIS:  pelvic pain  POST-OPERATIVE DIAGNOSIS:  pelvic pain, endometriosis  PROCEDURE:  Procedure(s): LAPAROSCOPY OPERATIVE LYSIS OF ADHESION  SURGEON:  Surgeon(s): Estefan Pattison S Mahogani Holohan  PHYSICIAN ASSISTANT:   ASSISTANTS: none   ANESTHESIA:   general  EBL:  Total I/O In: 1000 [I.V.:1000] Out: 40 [Blood:40]  BLOOD ADMINISTERED:none  DRAINS: none   LOCAL MEDICATIONS USED:  MARCAINE 6CC  SPECIMEN:  No Specimen  DISPOSITION OF SPECIMEN:  N/A  COUNTS:  YES  TOURNIQUET:  * No tourniquets in log *  DICTATION: .Other Dictation: Dictation Number [id number]  PLAN OF CARE: Discharge to home after PACU  PATIENT DISPOSITION:  PACU - hemodynamically stable.   Delay start of Pharmacological VTE agent (>24hrs) due to surgical blood loss or risk of bleeding:  {YES/NO/NOT APPLICABLE:20182

## 2011-03-05 NOTE — Transfer of Care (Signed)
Immediate Anesthesia Transfer of Care Note  Patient: Terri Mora  Procedure(s) Performed:  LAPAROSCOPY OPERATIVE - Ablation of endometriosis, Open laparoscopy; LYSIS OF ADHESION  Patient Location: PACU  Anesthesia Type: General  Level of Consciousness: awake and alert   Airway & Oxygen Therapy: Patient Spontanous Breathing and Patient connected to nasal cannula oxygen  Post-op Assessment: Report given to PACU RN and Post -op Vital signs reviewed and stable  Post vital signs: Reviewed and stable  Complications: No apparent anesthesia complications

## 2011-03-05 NOTE — H&P (Signed)
  History and physical exam unchanged 

## 2011-03-05 NOTE — Anesthesia Preprocedure Evaluation (Addendum)
Anesthesia Evaluation  Patient identified by MRN, date of birth, ID band Patient awake    Reviewed: Allergy & Precautions, H&P , Patient's Chart, lab work & pertinent test results, reviewed documented beta blocker date and time   Airway Mallampati: II TM Distance: >3 FB Neck ROM: full    Dental No notable dental hx. (+)    Pulmonary  clear to auscultation  Pulmonary exam normal       Cardiovascular regular Normal    Neuro/Psych    GI/Hepatic   Endo/Other    Renal/GU      Musculoskeletal   Abdominal   Peds  Hematology   Anesthesia Other Findings   Reproductive/Obstetrics                          Anesthesia Physical Anesthesia Plan  ASA: II  Anesthesia Plan: General   Post-op Pain Management:    Induction: Intravenous  Airway Management Planned: Oral ETT  Additional Equipment:   Intra-op Plan:   Post-operative Plan:   Informed Consent: I have reviewed the patients History and Physical, chart, labs and discussed the procedure including the risks, benefits and alternatives for the proposed anesthesia with the patient or authorized representative who has indicated his/her understanding and acceptance.   Dental Advisory Given  Plan Discussed with: CRNA and Surgeon  Anesthesia Plan Comments: (  Discussed  general anesthesia, including possible nausea, instrumentation of airway, sore throat,pulmonary aspiration, etc. I asked if the were any outstanding questions, or  concerns before we proceeded. )        Anesthesia Quick Evaluation

## 2011-03-05 NOTE — Anesthesia Procedure Notes (Signed)
Procedure Name: Intubation Date/Time: 03/05/2011 7:31 AM Performed by: Lincoln Brigham Pre-anesthesia Checklist: Patient identified, Timeout performed, Emergency Drugs available, Suction available and Patient being monitored Patient Re-evaluated:Patient Re-evaluated prior to inductionOxygen Delivery Method: Circle System Utilized Preoxygenation: Pre-oxygenation with 100% oxygen Intubation Type: IV induction Ventilation: Mask ventilation without difficulty Laryngoscope Size: Miller and 2 Grade View: Grade I Tube type: Oral Tube size: 7.0 mm Airway Equipment and Method: stylet Placement Confirmation: ETT inserted through vocal cords under direct vision,  positive ETCO2 and breath sounds checked- equal and bilateral Secured at: 22 cm Tube secured with: Tape Dental Injury: Teeth and Oropharynx as per pre-operative assessment

## 2011-03-08 ENCOUNTER — Encounter (HOSPITAL_COMMUNITY): Payer: Self-pay | Admitting: Obstetrics and Gynecology

## 2011-12-23 ENCOUNTER — Other Ambulatory Visit: Payer: Self-pay | Admitting: Gastroenterology

## 2011-12-23 DIAGNOSIS — R1031 Right lower quadrant pain: Secondary | ICD-10-CM

## 2011-12-24 ENCOUNTER — Ambulatory Visit
Admission: RE | Admit: 2011-12-24 | Discharge: 2011-12-24 | Disposition: A | Discharge status: Home / Self Care | Payer: Commercial Managed Care - PPO | Source: Ambulatory Visit | Attending: Gastroenterology | Admitting: Gastroenterology

## 2011-12-24 DIAGNOSIS — R1031 Right lower quadrant pain: Secondary | ICD-10-CM

## 2011-12-28 ENCOUNTER — Other Ambulatory Visit: Payer: PRIVATE HEALTH INSURANCE

## 2012-02-07 ENCOUNTER — Encounter (INDEPENDENT_AMBULATORY_CARE_PROVIDER_SITE_OTHER): Payer: Self-pay | Admitting: Surgery

## 2012-02-09 ENCOUNTER — Ambulatory Visit (INDEPENDENT_AMBULATORY_CARE_PROVIDER_SITE_OTHER): Payer: Commercial Managed Care - PPO | Admitting: Surgery

## 2012-02-18 ENCOUNTER — Ambulatory Visit (INDEPENDENT_AMBULATORY_CARE_PROVIDER_SITE_OTHER): Payer: Commercial Managed Care - PPO | Admitting: Surgery

## 2012-02-18 ENCOUNTER — Encounter (INDEPENDENT_AMBULATORY_CARE_PROVIDER_SITE_OTHER): Payer: Self-pay | Admitting: Surgery

## 2012-02-18 VITALS — BP 124/80 | HR 88 | Temp 97.0°F | Resp 16 | Ht 67.0 in | Wt 165.0 lb

## 2012-02-18 DIAGNOSIS — R1031 Right lower quadrant pain: Secondary | ICD-10-CM

## 2012-02-18 MED ORDER — TRAMADOL HCL 50 MG PO TABS: 50.0000 mg | ORAL_TABLET | Freq: Four times a day (QID) | ORAL | Status: DC | PRN

## 2012-02-18 NOTE — Progress Notes (Signed)
Patient ID: Terri Mora, female   DOB: 1970-11-25, 41 y.o.   MRN: [id number]  Chief Complaint  Patient presents with  . New Evaluation    right lower abd and groin pain    HPI Terri Mora is a 41 y.o. female.  Referred by Dr. Matthias Hughs for possible right ventral hernia HPI This is a 41 year old female who presents with a two-year history of episodes of right lower quadrant and right groin pain. The patient has under gone extensive workup. About a year ago she underwent laparoscopy by Dr. Arelia Sneddon Who ablated some areas of endometriosis as well as some adhesiolyse is. His operative note does not comment on any ventral hernia. Her symptoms improved somewhat but over the last several weeks she has had increasing pain in her right groin. This tends to occur when standing and especially when sneezing. The pain radiates towards her right hip and down her right leg. The pain improves when she is supine. She does not palpate any mass in this area. She was referred to Dr. Jeannetta Ellis for evaluation. There is no improvement on probiotic therapy. Her pain seems to be located right at the right edge of her C-section incision. She has had 3 C-sections in the past.   Past Medical History  Diagnosis Date  . Dysrhythmia     history of occasional pac-evaluated at [facility] 3 years ago-no treatment    Past Surgical History  Procedure Date  . Cesarean section     x3  . Lithotripsy 89  . Appendectomy 82  . Kidney stent removal   . Wisdom tooth extraction   . Colonoscopy   . Laparoscopy 03/05/2011    Procedure: LAPAROSCOPY OPERATIVE;  Surgeon: Juluis Mire;  Location: [facility];  Service: Gynecology;  Laterality: N/A;  Ablation of endometriosis, Open laparoscopy    Family History  Problem Relation Age of Onset  . Hyperlipidemia Mother   . Cancer Father   . Heart disease Father   . Hyperlipidemia Father     Social History History  Substance Use Topics  . Smoking status: Never Smoker   .  Smokeless tobacco: Not on file  . Alcohol Use: Yes    Allergies  Allergen Reactions  . Ivp Dye (Iodinated Diagnostic Agents)     Unknown reaction    Current Outpatient Prescriptions  Medication Sig Dispense Refill  . ibuprofen (ADVIL,MOTRIN) 200 MG tablet Take 400 mg by mouth every 6 (six) hours as needed. For pain or HA       . Multiple Vitamins-Minerals (MULTIVITAMINS THER. W/MINERALS) TABS Take 1 tablet by mouth daily. Takes when she remembers       . HYDROcodone-acetaminophen (VICODIN) 5-500 MG per tablet Take 1-2 tablets by mouth every 4 (four) hours as needed. Pain        . minocycline (DYNACIN) 100 MG tablet Take 100 mg by mouth 2 (two) times daily. For rosacea,  Gets upset stomach      . norgestimate-ethinyl estradiol (ORTHO-CYCLEN,SPRINTEC,PREVIFEM) 0.25-35 MG-MCG tablet Take 1 tablet by mouth daily.        . traMADol (ULTRAM) 50 MG tablet Take 1 tablet (50 mg total) by mouth every 6 (six) hours as needed for pain.  20 tablet  0    Review of Systems Review of Systems  Constitutional: Negative for fever, chills and unexpected weight change.  HENT: Negative for hearing loss, congestion, sore throat, trouble swallowing and voice change.   Eyes: Negative for visual disturbance.  Respiratory: Negative for cough  and wheezing.   Cardiovascular: Negative for chest pain, palpitations and leg swelling.  Gastrointestinal: Positive for abdominal pain. Negative for nausea, vomiting, diarrhea, constipation, blood in stool, abdominal distention and anal bleeding.  Genitourinary: Positive for pelvic pain. Negative for hematuria, vaginal bleeding and difficulty urinating.  Musculoskeletal: Negative for arthralgias.  Skin: Negative for rash and wound.  Neurological: Negative for seizures, syncope and headaches.  Hematological: Negative for adenopathy. Does not bruise/bleed easily.  Psychiatric/Behavioral: Negative for confusion.    Blood pressure 124/80, pulse 88, temperature 97 F (36.1  C), resp. rate 16, height 5\' 7"  (1.702 m), weight 165 lb (74.844 kg).  Physical Exam Physical Exam WDWN in NAD HEENT:  EOMI, sclera anicteric Neck:  No masses, no thyromegaly Lungs:  CTA bilaterally; normal respiratory effort CV:  Regular rate and rhythm; no murmurs Abd:  +bowel sounds, soft, non-tender, no masses Healed RLQ incision - no sign of hernia or tenderness R groin - healed Pfannenstiel incision; tender at right edge of incision - worse with Valsalva maneuver; no palpable masses Ext:  Well-perfused; no edema Skin:  Warm, dry; no sign of jaundice  Data Reviewed RADIOLOGY REPORT*  Clinical Data: Intermittent right lower quadrant pain x 2 years,  prior appendectomy, endometriosis  CT ABDOMEN AND PELVIS WITHOUT CONTRAST  Technique: Multidetector CT imaging of the abdomen and pelvis was  performed following the standard protocol without intravenous  contrast.  Comparison: [facility] CT abdomen/pelvis dated 03/18/2009  Findings: Lung bases are clear.  Unenhanced liver, spleen, pancreas, and adrenal glands are within  normal limits.  Gallbladder is underdistended. No intrahepatic or extrahepatic  ductal dilatation.  Kidneys are unremarkable. No renal calculi or hydronephrosis.  No evidence of bowel obstruction. Prior appendectomy.  No evidence of abdominal aortic aneurysm.  No abdominopelvic ascites.  Small retroperitoneal lymph nodes which do not meet pathologic CT  size criteria.  Uterus and bilateral ovaries are unremarkable.  No ureteral or bladder calculi. Bladder is unremarkable.  Mild fullness of the right inferior rectus muscle (series 3/image  78). This is more pronounced than on the prior, although it could  reflect interval postsurgical changes.  Mild degenerative changes of the visualized thoracolumbar spine.  IMPRESSION:  Prior appendectomy. No evidence of bowel obstruction.  No renal, ureteral, or bladder calculi. No hydronephrosis.  Mild fullness  of the right inferior rectus muscle, likely  postsurgical or an anatomic variant. If this corresponds to a  palpable abnormality, consider pelvic MRI (with/without contrast)  for further characterization.  Otherwise, no CT findings to account for the patient's chronic  right lower quadrant abdominal pain.a  Original Report Authenticated By: Charline Bills, M.D.   Assessment    Possible right inguinal/ incisional hernia at the site of her c-section incision.  No obvious hernia on physical examination.    Plan    Pelvic MRI vs right inguinal exploration.  We discussed the benefits and risks of each approach.  She would prefer further work-up before consenting to surgery.  We will obtain a pelvic MRI to evaluate for possible small hernia.  I gave her a prescription for Ultram to manage her symptoms.         Shameer Molstad K. 02/18/2012, 11:57 AM

## 2012-02-25 ENCOUNTER — Ambulatory Visit
Admission: RE | Admit: 2012-02-25 | Discharge: 2012-02-25 | Disposition: A | Discharge status: Home / Self Care | Payer: Commercial Managed Care - PPO | Source: Ambulatory Visit | Attending: Surgery | Admitting: Surgery

## 2012-02-25 DIAGNOSIS — R1031 Right lower quadrant pain: Secondary | ICD-10-CM

## 2012-02-25 MED ORDER — GADOBENATE DIMEGLUMINE 529 MG/ML IV SOLN
15.0000 mL | Freq: Once | INTRAVENOUS | Status: AC | PRN
  Administered 2012-02-25: 15 mL via INTRAVENOUS

## 2012-02-28 ENCOUNTER — Telehealth (INDEPENDENT_AMBULATORY_CARE_PROVIDER_SITE_OTHER): Payer: Self-pay

## 2012-02-28 NOTE — Telephone Encounter (Signed)
Pt spoke with Dr. Fatima Sanger nurse early today and was told he would go over her MRI results at her appt on 03/07/12.  She called back and asked if he would please call her before the holiday weekend to explain these results.  She would feel better knowing before 03/07/12.  She can be reached at [phone].

## 2012-03-01 NOTE — Telephone Encounter (Signed)
I spoke with her.  She will come in next week for her appt and will try to schedule surgery soon.

## 2012-03-07 ENCOUNTER — Encounter (INDEPENDENT_AMBULATORY_CARE_PROVIDER_SITE_OTHER): Payer: Self-pay | Admitting: Surgery

## 2012-03-07 ENCOUNTER — Ambulatory Visit (INDEPENDENT_AMBULATORY_CARE_PROVIDER_SITE_OTHER): Payer: Commercial Managed Care - PPO | Admitting: Surgery

## 2012-03-07 VITALS — BP 138/84 | HR 82 | Temp 98.8°F | Resp 16 | Ht 67.0 in | Wt 164.8 lb

## 2012-03-07 DIAGNOSIS — R1903 Right lower quadrant abdominal swelling, mass and lump: Secondary | ICD-10-CM

## 2012-03-07 NOTE — Progress Notes (Signed)
Patient ID: Terri Mora, female   DOB: 08/08/1970, 41 y.o.   MRN: [id number]    Progress Notes   Aleira G Vaquera (MR# [id number])       Progress Notes Info       Author Note Status Last Update User Last Update Date/Time    Delbert Vu K. Khadija Thier, MD Signed Aisha Greenberger K. Tonnette Zwiebel, MD 02/18/2012 12:04 PM      Progress Notes     Patient ID: Terri Mora, female   DOB: 02/18/1971, 41 y.o.   MRN: [id number]    Chief Complaint   Patient presents with   .  New Evaluation       right lower abd and groin pain        HPI Terri Mora is a 41 y.o. female.  Referred by Dr. Buccini for possible right ventral hernia HPI This is a 41-year-old female who presents with a two-year history of episodes of right lower quadrant and right groin pain. The patient has under gone extensive workup. About a year ago she underwent laparoscopy by Dr. Mccomb Who ablated some areas of endometriosis as well as some adhesiolyse is. His operative note does not comment on any ventral hernia. Her symptoms improved somewhat but over the last several weeks she has had increasing pain in her right groin. This tends to occur when standing and especially when sneezing. The pain radiates towards her right hip and down her right leg. The pain improves when she is supine. She does not palpate any mass in this area. She was referred to Dr. Pacini for evaluation. There is no improvement on probiotic therapy. Her pain seems to be located right at the right edge of her C-section incision. She has had 3 C-sections in the past.  Pelvic MRI shows an enhancing mass in the bottom of the right rectus muscle that corresponds to the area of tenderness.       Past Medical History   Diagnosis  Date   .  Dysrhythmia         history of occasional pac-evaluated at [facility] 3 years ago-no treatment         Past Surgical History   Procedure  Date   .  Cesarean section         x3   .  Lithotripsy  89   .  Appendectomy  82    .  Kidney stent removal     .  Wisdom tooth extraction     .  Colonoscopy     .  Laparoscopy  03/05/2011       Procedure: LAPAROSCOPY OPERATIVE;  Surgeon: John S McComb;  Location: [facility];  Service: Gynecology;  Laterality: N/A;  Ablation of endometriosis, Open laparoscopy         Family History   Problem  Relation  Age of Onset   .  Hyperlipidemia  Mother     .  Cancer  Father     .  Heart disease  Father     .  Hyperlipidemia  Father          Social History History   Substance Use Topics   .  Smoking status:  Never Smoker    .  Smokeless tobacco:  Not on file   .  Alcohol Use:  Yes         Allergies   Allergen  Reactions   .  Ivp Dye (Iodinated Diagnostic Agents)           Unknown reaction         Current Outpatient Prescriptions   Medication  Sig  Dispense  Refill   .  ibuprofen (ADVIL,MOTRIN) 200 MG tablet  Take 400 mg by mouth every 6 (six) hours as needed. For pain or HA          .  Multiple Vitamins-Minerals (MULTIVITAMINS THER. W/MINERALS) TABS  Take 1 tablet by mouth daily. Takes when she remembers          .  HYDROcodone-acetaminophen (VICODIN) 5-500 MG per tablet  Take 1-2 tablets by mouth every 4 (four) hours as needed. Pain            .  minocycline (DYNACIN) 100 MG tablet  Take 100 mg by mouth 2 (two) times daily. For rosacea,  Gets upset stomach         .  norgestimate-ethinyl estradiol (ORTHO-CYCLEN,SPRINTEC,PREVIFEM) 0.25-35 MG-MCG tablet  Take 1 tablet by mouth daily.           .  traMADol (ULTRAM) 50 MG tablet  Take 1 tablet (50 mg total) by mouth every 6 (six) hours as needed for pain.   20 tablet   0        Review of Systems Review of Systems  Constitutional: Negative for fever, chills and unexpected weight change.  HENT: Negative for hearing loss, congestion, sore throat, trouble swallowing and voice change.   Eyes: Negative for visual disturbance.  Respiratory: Negative for cough and wheezing.   Cardiovascular: Negative for chest pain,  palpitations and leg swelling.  Gastrointestinal: Positive for abdominal pain. Negative for nausea, vomiting, diarrhea, constipation, blood in stool, abdominal distention and anal bleeding.  Genitourinary: Positive for pelvic pain. Negative for hematuria, vaginal bleeding and difficulty urinating.  Musculoskeletal: Negative for arthralgias.  Skin: Negative for rash and wound.  Neurological: Negative for seizures, syncope and headaches.  Hematological: Negative for adenopathy. Does not bruise/bleed easily.  Psychiatric/Behavioral: Negative for confusion.      Blood pressure 124/80, pulse 88, temperature 97 F (36.1 C), resp. rate 16, height 5' 7" (1.702 m), weight 165 lb (74.844 kg).   Physical Exam Physical Exam WDWN in NAD HEENT:  EOMI, sclera anicteric Neck:  No masses, no thyromegaly Lungs:  CTA bilaterally; normal respiratory effort CV:  Regular rate and rhythm; no murmurs Abd:  +bowel sounds, soft, non-tender, no masses Healed RLQ incision - no sign of hernia or tenderness R groin - healed Pfannenstiel incision; tender at right edge of incision - worse with Valsalva maneuver; no palpable masses Ext:  Well-perfused; no edema Skin:  Warm, dry; no sign of jaundice   Data Reviewed RADIOLOGY REPORT*   Clinical Data: Intermittent right lower quadrant pain x 2 years,   prior appendectomy, endometriosis   CT ABDOMEN AND PELVIS WITHOUT CONTRAST   Technique: Multidetector CT imaging of the abdomen and pelvis was   performed following the standard protocol without intravenous   contrast.   Comparison: [facility] CT abdomen/pelvis dated 03/18/2009   Findings: Lung bases are clear.   Unenhanced liver, spleen, pancreas, and adrenal glands are within   normal limits.   Gallbladder is underdistended. No intrahepatic or extrahepatic   ductal dilatation.   Kidneys are unremarkable. No renal calculi or hydronephrosis.   No evidence of bowel obstruction. Prior appendectomy.   No  evidence of abdominal aortic aneurysm.   No abdominopelvic ascites.   Small retroperitoneal lymph nodes which do not meet pathologic CT   size criteria.   Uterus and   bilateral ovaries are unremarkable.   No ureteral or bladder calculi. Bladder is unremarkable.   Mild fullness of the right inferior rectus muscle (series 3/image   78). This is more pronounced than on the prior, although it could   reflect interval postsurgical changes.   Mild degenerative changes of the visualized thoracolumbar spine.   IMPRESSION:   Prior appendectomy. No evidence of bowel obstruction.   No renal, ureteral, or bladder calculi. No hydronephrosis.   Mild fullness of the right inferior rectus muscle, likely   postsurgical or an anatomic variant. If this corresponds to a   palpable abnormality, consider pelvic MRI (with/without contrast)   for further characterization.   Otherwise, no CT findings to account for the patient's chronic   right lower quadrant abdominal pain.a   Original Report Authenticated By: SRIYESH KRISHNAN, M.D.  RADIOLOGY REPORT*  Clinical Data: History of right inguinal pain around lateral side  of C-section scar. Prior appendectomy and laparoscopic surgery in  this area. Pain started 2 months ago. Rule out hernia. Prior CT  describing a history of endometriosis.  MRI PELVIS WITHOUT AND WITH CONTRAST  Technique: Multiplanar multisequence MR imaging of the pelvis was  performed both before and after administration of intravenous  contrast.  Contrast: 15mL MULTIHANCE GADOBENATE DIMEGLUMINE 529 MG/ML IV SOLN  Comparison: 12/24/2011 CT.  Findings: Normal pelvic bowel loops. Normal urinary bladder.  Nonspecific ill definition of the junctional zone within the  uterus. Normal endometrium.  Both ovaries are identified and are within normal limits. Example  image 11/series 5. No free pelvic fluid or pelvic adenopathy.  Corresponding to the abnormality described on CT, is soft tissue   fullness involving the inferior aspect of the right rectus muscle.  This is isointense to the remainder of the muscles on T1-weighted  imaging. Subtly T2 hyperintense on image 17 and 18/series 6.  After contrast administration, demonstrates moderate post contrast  enhancement. Example image 17/series 10.  The palpable abnormality is positioned along the lateral aspect of  this rectus muscle. There is no underlying hernia identified.  IMPRESSION:  Asymmetric soft tissue fullness with subtle abnormal T2 signal and  mild to moderate hyperenhancement involving the inferior right  rectus muscle. 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 Giving the history of endometriosis described on  the prior CT, abdominal wall endometriosis would be an alternate  explanation.  These results will be called to the ordering clinician or  representative by the Radiologist Assistant, and communication  documented in the PACS Dashboard.  Original Report Authenticated By: Kyle Talbot, M.D.     Assessment   Right rectus mass - 2 cm. This could represent an endometrioma, as the symptoms change with her menstrual cycle.  This could also represent an dermoid tumor      Plan    Excision of mass - right rectus muscle - 2 cm subfascial. The surgical procedure has been discussed with the patient.  Potential risks, benefits, alternative treatments, and expected outcomes have been explained.  All of the patient's questions at this time have been answered.  The likelihood of reaching the patient's treatment goal is good.  The patient understand the proposed surgical procedure and wishes to proceed.           Kaylina Cahue K. Jaice Lague, MD, FACS [facility]  03/07/2012 11:57 AM             

## 2012-03-10 ENCOUNTER — Encounter (HOSPITAL_BASED_OUTPATIENT_CLINIC_OR_DEPARTMENT_OTHER): Payer: Self-pay | Admitting: *Deleted

## 2012-03-14 ENCOUNTER — Encounter (INDEPENDENT_AMBULATORY_CARE_PROVIDER_SITE_OTHER): Payer: Commercial Managed Care - PPO | Admitting: General Surgery

## 2012-03-15 ENCOUNTER — Encounter (HOSPITAL_BASED_OUTPATIENT_CLINIC_OR_DEPARTMENT_OTHER): Payer: Self-pay | Admitting: *Deleted

## 2012-03-15 ENCOUNTER — Ambulatory Visit (HOSPITAL_BASED_OUTPATIENT_CLINIC_OR_DEPARTMENT_OTHER)
Admission: RE | Admit: 2012-03-15 | Discharge: 2012-03-15 | Disposition: A | Discharge status: Home / Self Care | Payer: Commercial Managed Care - PPO | Source: Ambulatory Visit | Attending: Surgery | Admitting: Surgery

## 2012-03-15 ENCOUNTER — Ambulatory Visit (HOSPITAL_BASED_OUTPATIENT_CLINIC_OR_DEPARTMENT_OTHER): Payer: Commercial Managed Care - PPO | Admitting: Anesthesiology

## 2012-03-15 ENCOUNTER — Encounter (HOSPITAL_BASED_OUTPATIENT_CLINIC_OR_DEPARTMENT_OTHER): Payer: Self-pay | Admitting: Anesthesiology

## 2012-03-15 ENCOUNTER — Telehealth (INDEPENDENT_AMBULATORY_CARE_PROVIDER_SITE_OTHER): Payer: Self-pay | Admitting: General Surgery

## 2012-03-15 ENCOUNTER — Encounter (HOSPITAL_BASED_OUTPATIENT_CLINIC_OR_DEPARTMENT_OTHER)
Admission: RE | Disposition: A | Discharge status: Home / Self Care | Payer: Self-pay | Source: Ambulatory Visit | Attending: Surgery

## 2012-03-15 DIAGNOSIS — R1903 Right lower quadrant abdominal swelling, mass and lump: Secondary | ICD-10-CM

## 2012-03-15 DIAGNOSIS — N808 Other endometriosis: Secondary | ICD-10-CM

## 2012-03-15 DIAGNOSIS — Z91041 Radiographic dye allergy status: Secondary | ICD-10-CM | POA: Insufficient documentation

## 2012-03-15 DIAGNOSIS — Z8249 Family history of ischemic heart disease and other diseases of the circulatory system: Secondary | ICD-10-CM | POA: Insufficient documentation

## 2012-03-15 DIAGNOSIS — Z809 Family history of malignant neoplasm, unspecified: Secondary | ICD-10-CM | POA: Insufficient documentation

## 2012-03-15 DIAGNOSIS — Z9089 Acquired absence of other organs: Secondary | ICD-10-CM | POA: Insufficient documentation

## 2012-03-15 DIAGNOSIS — I499 Cardiac arrhythmia, unspecified: Secondary | ICD-10-CM | POA: Insufficient documentation

## 2012-03-15 HISTORY — DX: Headache: R51

## 2012-03-15 HISTORY — DX: Unspecified temporomandibular joint disorder, unspecified side: M26.609

## 2012-03-15 HISTORY — DX: Personal history of urinary calculi: Z87.442

## 2012-03-15 HISTORY — DX: Dental restoration status: Z98.811

## 2012-03-15 HISTORY — PX: MASS EXCISION: SHX2000

## 2012-03-15 SURGERY — EXCISION MASS
Anesthesia: General | Laterality: Right

## 2012-03-15 MED ORDER — BUPIVACAINE LIPOSOME 1.3 % IJ SUSP
INTRAMUSCULAR | Status: DC | PRN
  Administered 2012-03-15: 20 mL

## 2012-03-15 MED ORDER — ONDANSETRON HCL 4 MG/2ML IJ SOLN: 4.0000 mg | INTRAMUSCULAR | Status: DC | PRN

## 2012-03-15 MED ORDER — CEFAZOLIN SODIUM-DEXTROSE 2-3 GM-% IV SOLR
2.0000 g | INTRAVENOUS | Status: AC
  Administered 2012-03-15: 2 g via INTRAVENOUS

## 2012-03-15 MED ORDER — LACTATED RINGERS IV SOLN
INTRAVENOUS | Status: DC
  Administered 2012-03-15 (×2): via INTRAVENOUS

## 2012-03-15 MED ORDER — FENTANYL CITRATE 0.05 MG/ML IJ SOLN
INTRAMUSCULAR | Status: DC | PRN
  Administered 2012-03-15: 50 ug via INTRAVENOUS
  Administered 2012-03-15 (×2): 25 ug via INTRAVENOUS

## 2012-03-15 MED ORDER — PROPOFOL 10 MG/ML IV BOLUS
INTRAVENOUS | Status: DC | PRN
  Administered 2012-03-15: 150 mg via INTRAVENOUS

## 2012-03-15 MED ORDER — FENTANYL CITRATE 0.05 MG/ML IJ SOLN: 50.0000 ug | INTRAMUSCULAR | Status: DC | PRN

## 2012-03-15 MED ORDER — MIDAZOLAM HCL 2 MG/2ML IJ SOLN: 0.5000 mg | Freq: Once | INTRAMUSCULAR | Status: DC | PRN

## 2012-03-15 MED ORDER — PROMETHAZINE HCL 25 MG/ML IJ SOLN: 6.2500 mg | INTRAMUSCULAR | Status: DC | PRN

## 2012-03-15 MED ORDER — DEXAMETHASONE SODIUM PHOSPHATE 4 MG/ML IJ SOLN
INTRAMUSCULAR | Status: DC | PRN
  Administered 2012-03-15: 10 mg via INTRAVENOUS

## 2012-03-15 MED ORDER — MEPERIDINE HCL 25 MG/ML IJ SOLN: 6.2500 mg | INTRAMUSCULAR | Status: DC | PRN

## 2012-03-15 MED ORDER — OXYCODONE HCL 5 MG/5ML PO SOLN: 5.0000 mg | Freq: Once | ORAL | Status: AC | PRN

## 2012-03-15 MED ORDER — MIDAZOLAM HCL 2 MG/2ML IJ SOLN: 1.0000 mg | INTRAMUSCULAR | Status: DC | PRN

## 2012-03-15 MED ORDER — MIDAZOLAM HCL 5 MG/5ML IJ SOLN
INTRAMUSCULAR | Status: DC | PRN
  Administered 2012-03-15: 2 mg via INTRAVENOUS

## 2012-03-15 MED ORDER — OXYCODONE-ACETAMINOPHEN 5-325 MG PO TABS: 1.0000 | ORAL_TABLET | ORAL | Status: DC | PRN

## 2012-03-15 MED ORDER — MORPHINE SULFATE 2 MG/ML IJ SOLN: 2.0000 mg | INTRAMUSCULAR | Status: DC | PRN

## 2012-03-15 MED ORDER — OXYCODONE HCL 5 MG PO TABS
5.0000 mg | ORAL_TABLET | Freq: Once | ORAL | Status: AC | PRN
  Administered 2012-03-15: 5 mg via ORAL

## 2012-03-15 MED ORDER — KETOROLAC TROMETHAMINE 30 MG/ML IJ SOLN
INTRAMUSCULAR | Status: DC | PRN
  Administered 2012-03-15: 30 mg via INTRAVENOUS

## 2012-03-15 MED ORDER — FENTANYL CITRATE 0.05 MG/ML IJ SOLN
25.0000 ug | INTRAMUSCULAR | Status: DC | PRN
  Administered 2012-03-15 (×4): 25 ug via INTRAVENOUS

## 2012-03-15 MED ORDER — OXYCODONE-ACETAMINOPHEN 5-325 MG PO TABS
1.0000 | ORAL_TABLET | ORAL | Status: DC | PRN
Start: 2012-03-15 — End: 2012-03-15

## 2012-03-15 MED ORDER — LIDOCAINE HCL (CARDIAC) 20 MG/ML IV SOLN
INTRAVENOUS | Status: DC | PRN
  Administered 2012-03-15: 30 mg via INTRAVENOUS

## 2012-03-15 MED ORDER — CHLORHEXIDINE GLUCONATE 4 % EX LIQD: 1.0000 "application " | Freq: Once | CUTANEOUS | Status: DC

## 2012-03-15 MED ORDER — ONDANSETRON HCL 4 MG/2ML IJ SOLN
INTRAMUSCULAR | Status: DC | PRN
  Administered 2012-03-15: 4 mg via INTRAVENOUS

## 2012-03-15 SURGICAL SUPPLY — 44 items
BENZOIN TINCTURE PRP APPL 2/3 (GAUZE/BANDAGES/DRESSINGS) ×3 IMPLANT
BLADE SURG 15 STRL LF DISP TIS (BLADE) ×2 IMPLANT
BLADE SURG 15 STRL SS (BLADE) ×1
BLADE SURG ROTATE 9660 (MISCELLANEOUS) ×3 IMPLANT
CANISTER SUCTION 1200CC (MISCELLANEOUS) ×3 IMPLANT
CHLORAPREP W/TINT 26ML (MISCELLANEOUS) ×3 IMPLANT
COVER MAYO STAND STRL (DRAPES) ×3 IMPLANT
COVER TABLE BACK 60X90 (DRAPES) ×3 IMPLANT
DECANTER SPIKE VIAL GLASS SM (MISCELLANEOUS) IMPLANT
DRAPE PED LAPAROTOMY (DRAPES) ×3 IMPLANT
DRAPE UTILITY XL STRL (DRAPES) ×3 IMPLANT
DRSG TEGADERM 4X4.75 (GAUZE/BANDAGES/DRESSINGS) ×3 IMPLANT
ELECT COATED BLADE 2.86 ST (ELECTRODE) ×3 IMPLANT
ELECT REM PT RETURN 9FT ADLT (ELECTROSURGICAL) ×3
ELECTRODE REM PT RTRN 9FT ADLT (ELECTROSURGICAL) ×2 IMPLANT
GAUZE SPONGE 4X4 12PLY STRL LF (GAUZE/BANDAGES/DRESSINGS) ×3 IMPLANT
GLOVE BIO SURGEON STRL SZ 6.5 (GLOVE) ×3 IMPLANT
GLOVE BIO SURGEON STRL SZ7 (GLOVE) ×6 IMPLANT
GLOVE BIO SURGEON STRL SZ7.5 (GLOVE) ×3 IMPLANT
GLOVE BIOGEL PI IND STRL 7.5 (GLOVE) ×4 IMPLANT
GLOVE BIOGEL PI IND STRL 8 (GLOVE) ×2 IMPLANT
GLOVE BIOGEL PI INDICATOR 7.5 (GLOVE) ×2
GLOVE BIOGEL PI INDICATOR 8 (GLOVE) ×1
GOWN PREVENTION PLUS XLARGE (GOWN DISPOSABLE) ×9 IMPLANT
GOWN PREVENTION PLUS XXLARGE (GOWN DISPOSABLE) ×3 IMPLANT
NEEDLE HYPO 25X1 1.5 SAFETY (NEEDLE) ×3 IMPLANT
NS IRRIG 1000ML POUR BTL (IV SOLUTION) ×3 IMPLANT
PACK BASIN DAY SURGERY FS (CUSTOM PROCEDURE TRAY) ×3 IMPLANT
PENCIL BUTTON HOLSTER BLD 10FT (ELECTRODE) ×3 IMPLANT
STRIP CLOSURE SKIN 1/2X4 (GAUZE/BANDAGES/DRESSINGS) ×3 IMPLANT
SUT MON AB 4-0 PC3 18 (SUTURE) ×3 IMPLANT
SUT PROLENE 6 0 P 1 18 (SUTURE) IMPLANT
SUT SILK 2 0 FS (SUTURE) ×3 IMPLANT
SUT VIC AB 0 CT1 18XCR BRD 8 (SUTURE) ×2 IMPLANT
SUT VIC AB 0 CT1 8-18 (SUTURE) ×1
SUT VIC AB 3-0 SH 27 (SUTURE) ×3
SUT VIC AB 3-0 SH 27X BRD (SUTURE) ×6 IMPLANT
SUT VICRYL 3-0 CR8 SH (SUTURE) IMPLANT
SYR CONTROL 10ML LL (SYRINGE) ×3 IMPLANT
TOWEL OR 17X24 6PK STRL BLUE (TOWEL DISPOSABLE) ×3 IMPLANT
TOWEL OR NON WOVEN STRL DISP B (DISPOSABLE) ×3 IMPLANT
TUBE CONNECTING 20X1/4 (TUBING) ×3 IMPLANT
WATER STERILE IRR 1000ML POUR (IV SOLUTION) IMPLANT
YANKAUER SUCT BULB TIP NO VENT (SUCTIONS) ×3 IMPLANT

## 2012-03-15 NOTE — Op Note (Signed)
Preop diagnosis: Mass right rectus sheath Postop diagnosis: Same Procedure performed excision of mass right rectus sheath Surgeon:Reynoldo Mainer K. Assistant: Dr. Emelia Loron Anesthesia: Gen. Via LMA Indications:This is a healthy 41 year old female who presents with a two-year history of intermittent episodes of right lower quadrant and right groin pain. She has had previous laparoscopy that showed a few areas of endometriosis. She was referred to me for further evaluation. I did not palpate a hernia but I referred the patient for a pelvic MRI. This showed an enhancing mass in the distal right rectus muscle that corresponds to the area of tenderness. She presents now for excision.  Description of procedure: The patient brought to the operating room and placed in a supine position on the operating room table.  After an adequate level of general anesthesia was obtained the patient's lower abdomen and groin were shaved prepped with ChloraPrep and draped in sterile fashion. A timeout was taken to ensure the proper patient proper procedure. We opened up the the right side of her previous C-section incision for a distance of about 5 cm. Dissection was carried down through the subcutaneous tissues with cautery. We encountered the anterior rectus sheath. There was significant scarring in this area from her previous C-section. We divided the anterior rectus sheath transversely. We dissected down around the right edge of the rectus muscle. We couldn't palpate a firm mass within the rectus muscle measuring about 2-1/2 cm across. We were able to dissect this free using cautery. We did not enter the peritoneum posteriorly. The inferior epigastric vessels were ligated with 2-0 Vicryl sutures. The mass was removed entirely. There is some oozing of brownish material consistent with an endometrioma. We irrigated the wound thoroughly. We palpated and no other masses noted. We inspected again for hemostasis. We closed the  anterior rectus sheath with multiple 0 Vicryl sutures. The subcutaneous tissues were closed with 3-0 Vicryl sutures. 4-0 Monocryl was used to close the skin. Steri-Strips and clean dressing were applied. The patient was then extubated and brought to the recovery room in stable condition. All sponge, initially, and needle counts are correct  Wilmon Arms. Corliss Skains, MD, [facility]  03/15/2012 4:25 PM

## 2012-03-15 NOTE — Discharge Instructions (Signed)
[facility]  POST OP INSTRUCTIONS  Always review your discharge instruction sheet given to you by the facility where your surgery was performed. IF YOU HAVE DISABILITY OR FAMILY LEAVE FORMS, YOU MUST BRING THEM TO THE OFFICE FOR PROCESSING.   DO NOT GIVE THEM TO YOUR DOCTOR.  1. A  prescription for pain medication may be given to you upon discharge.  Take your pain medication as prescribed, if needed.  If narcotic pain medicine is not needed, then you may take acetaminophen (Tylenol) or ibuprofen (Advil) as needed. 2. Take your usually prescribed medications unless otherwise directed. 3. If you need a refill on your pain medication, please contact your pharmacy.  They will contact our office to request authorization. Prescriptions will not be filled after 5 pm or on week-ends. 4. You should follow a light diet the first 24 hours after arrival home, such as soup and crackers, etc.  Be sure to include lots of fluids daily.  Resume your normal diet the day after surgery. 5. Most patients will experience some swelling and bruising around the incision in the groin. Ice packs and reclining will help.  Swelling and bruising can take several days to resolve.  6. It is common to experience some constipation if taking pain medication after surgery.  Increasing fluid intake and taking a stool softener (such as Colace) will usually help or prevent this problem from occurring.  A mild laxative (Milk of Magnesia or Miralax) should be taken according to package directions if there are no bowel movements after 48 hours. 7. Unless discharge instructions indicate otherwise, you may remove your bandages 48 hours after surgery, and you may shower at that time.  You will have steri-strips (small skin tapes) in place directly over the incision.  These strips should be left on the skin for 7-10 days. 8. ACTIVITIES:  You may resume regular (light) daily activities beginning the next day--such as daily  self-care, walking, climbing stairs--gradually increasing activities as tolerated.  You may have sexual intercourse when it is comfortable.  Refrain from any heavy lifting or straining until approved by your doctor. a. You may drive when you are no longer taking prescription pain medication, you can comfortably wear a seatbelt, and you can safely maneuver your car and apply brakes. b. RETURN TO WORK:  2-3 weeks with light duty - no lifting over 15 lbs. 9. You should see your doctor in the office for a follow-up appointment approximately 2-3 weeks after your surgery.  Make sure that you call for this appointment within a day or two after you arrive home to insure a convenient appointment time. 10. OTHER INSTRUCTIONS:  __________________________________________________________________________________________________________________________________________________________________________________________  WHEN TO CALL YOUR DOCTOR: 1. Fever over 101.0 2. Inability to urinate 3. Nausea and/or vomiting 4. Extreme swelling or bruising 5. Continued bleeding from incision. 6. Increased pain, redness, or drainage from the incision  The clinic staff is available to answer your questions during regular business hours.  Please dont hesitate to call and ask to speak to one of the nurses for clinical concerns.  If you have a medical emergency, go to the nearest emergency room or call 911.  A surgeon from [facility] is always on call at the hospital   [address], [place], [place]  [address] ?  [address] [address], [place], [place]   [address] [phone]    FAX [phone] Web site: [email or web address]   Post Anesthesia Home Care Instructions  Activity: Get plenty of rest for the remainder of the day. A responsible adult should stay with you for 24 hours following the procedure.  For the next 24 hours, DO NOT: -Drive a car -Social worker -Drink alcoholic beverages -Take any medication unless instructed by your physician -Make any legal decisions or sign important papers.  Meals: Start with liquid foods such as gelatin or soup. Progress to regular foods as tolerated. Avoid greasy, spicy, heavy foods. If nausea and/or vomiting occur, drink only clear liquids until the nausea and/or vomiting subsides. Call your physician if vomiting continues.  Special Instructions/Symptoms: Your throat may feel dry or sore from the anesthesia or the breathing tube placed in your throat during surgery. If this causes discomfort, gargle with warm salt water. The discomfort should disappear within 24 hours.

## 2012-03-15 NOTE — Transfer of Care (Signed)
Immediate Anesthesia Transfer of Care Note  Patient: Terri Mora  Procedure(s) Performed: Procedure(s) (LRB) with comments: EXCISION MASS (Right) - excision mass-right rectus muscle, possible mesh repair  Patient Location: PACU  Anesthesia Type:General  Level of Consciousness: sedated and patient cooperative  Airway & Oxygen Therapy: Patient Spontanous Breathing and Patient connected to face mask oxygen  Post-op Assessment: Report given to PACU RN and Post -op Vital signs reviewed and stable  Post vital signs: Reviewed and stable  Complications: No apparent anesthesia complications

## 2012-03-15 NOTE — Anesthesia Preprocedure Evaluation (Addendum)
Anesthesia Evaluation  Patient identified by MRN, date of birth, ID band Patient awake    Reviewed: Allergy & Precautions, H&P , NPO status , Patient's Chart, lab work & pertinent test results  History of Anesthesia Complications Negative for: history of anesthetic complications  Airway Mallampati: I TM Distance: >3 FB Neck ROM: Full    Dental  (+) Dental Advisory Given Upper incisor bonded:   Pulmonary neg pulmonary ROS, Recent URI , Resolved,  breath sounds clear to auscultation  Pulmonary exam normal       Cardiovascular negative cardio ROS  + dysrhythmias (benign PACs) Rhythm:Regular Rate:Normal     Neuro/Psych negative neurological ROS  negative psych ROS   GI/Hepatic negative GI ROS, Neg liver ROS,   Endo/Other  negative endocrine ROS  Renal/GU negative Renal ROS     Musculoskeletal   Abdominal   Peds  Hematology negative hematology ROS (+)   Anesthesia Other Findings   Reproductive/Obstetrics LMP 02/08/12, patient reports preg test at home this am is negative, declines further testing                          Anesthesia Physical Anesthesia Plan  ASA: I  Anesthesia Plan: General   Post-op Pain Management:    Induction: Intravenous  Airway Management Planned: LMA  Additional Equipment:   Intra-op Plan:   Post-operative Plan:   Informed Consent: I have reviewed the patients History and Physical, chart, labs and discussed the procedure including the risks, benefits and alternatives for the proposed anesthesia with the patient or authorized representative who has indicated his/her understanding and acceptance.   Dental advisory given  Plan Discussed with: CRNA and Surgeon  Anesthesia Plan Comments: (Plan routine monitors, GA -LMA OK)        Anesthesia Quick Evaluation

## 2012-03-15 NOTE — Interval H&P Note (Signed)
History and Physical Interval Note:  03/15/2012 2:54 PM  Terri Mora  has presented today for surgery, with the diagnosis of mass right rectus muscle 2 cm  The various methods of treatment have been discussed with the patient and family. After consideration of risks, benefits and other options for treatment, the patient has consented to  Procedure(s) (LRB) with comments: EXCISION MASS (Right) - excision mass-right rectus muscle, possible mesh repair INSERTION OF MESH (N/A) as a surgical intervention .  The patient's history has been reviewed, patient examined, no change in status, stable for surgery.  I have reviewed the patient's chart and labs.  Questions were answered to the patient's satisfaction.     Ares Cardozo K.

## 2012-03-15 NOTE — Telephone Encounter (Signed)
She called about headache and asked if she could take anything for relief since her surgery was tomorrow.  I explained that tylenol should be okay to take prior to her surgery.

## 2012-03-15 NOTE — H&P (View-Only) (Signed)
Patient ID: Terri Mora, female   DOB: 1970-11-17, 41 y.o.   MRN: [id number]    Progress Notes   Terri Mora (MR# [id number])       Progress Notes Info       Author Note Status Last Update User Last Update Date/Time    Wilmon Arms. Corliss Skains, MD Signed Wilmon Arms. Corliss Skains, MD 02/18/2012 12:04 PM      Progress Notes     Patient ID: Terri Mora, female   DOB: 03/11/1971, 41 y.o.   MRN: [id number]    Chief Complaint   Patient presents with   .  New Evaluation       right lower abd and groin pain        HPI GISSELLA Mora is a 41 y.o. female.  Referred by Dr. Matthias Hughs for possible right ventral hernia HPI This is a 41 year old female who presents with a two-year history of episodes of right lower quadrant and right groin pain. The patient has under gone extensive workup. About a year ago she underwent laparoscopy by Dr. Arelia Sneddon Who ablated some areas of endometriosis as well as some adhesiolyse is. His operative note does not comment on any ventral hernia. Her symptoms improved somewhat but over the last several weeks she has had increasing pain in her right groin. This tends to occur when standing and especially when sneezing. The pain radiates towards her right hip and down her right leg. The pain improves when she is supine. She does not palpate any mass in this area. She was referred to Dr. Jeannetta Ellis for evaluation. There is no improvement on probiotic therapy. Her pain seems to be located right at the right edge of her C-section incision. She has had 3 C-sections in the past.  Pelvic MRI shows an enhancing mass in the bottom of the right rectus muscle that corresponds to the area of tenderness.       Past Medical History   Diagnosis  Date   .  Dysrhythmia         history of occasional pac-evaluated at [facility] 3 years ago-no treatment         Past Surgical History   Procedure  Date   .  Cesarean section         x3   .  Lithotripsy  89   .  Appendectomy  82    .  Kidney stent removal     .  Wisdom tooth extraction     .  Colonoscopy     .  Laparoscopy  03/05/2011       Procedure: LAPAROSCOPY OPERATIVE;  Surgeon: Juluis Mire;  Location: [facility];  Service: Gynecology;  Laterality: N/A;  Ablation of endometriosis, Open laparoscopy         Family History   Problem  Relation  Age of Onset   .  Hyperlipidemia  Mother     .  Cancer  Father     .  Heart disease  Father     .  Hyperlipidemia  Father          Social History History   Substance Use Topics   .  Smoking status:  Never Smoker    .  Smokeless tobacco:  Not on file   .  Alcohol Use:  Yes         Allergies   Allergen  Reactions   .  Ivp Dye (Iodinated Diagnostic Agents)  Unknown reaction         Current Outpatient Prescriptions   Medication  Sig  Dispense  Refill   .  ibuprofen (ADVIL,MOTRIN) 200 MG tablet  Take 400 mg by mouth every 6 (six) hours as needed. For pain or HA          .  Multiple Vitamins-Minerals (MULTIVITAMINS THER. W/MINERALS) TABS  Take 1 tablet by mouth daily. Takes when she remembers          .  HYDROcodone-acetaminophen (VICODIN) 5-500 MG per tablet  Take 1-2 tablets by mouth every 4 (four) hours as needed. Pain            .  minocycline (DYNACIN) 100 MG tablet  Take 100 mg by mouth 2 (two) times daily. For rosacea,  Gets upset stomach         .  norgestimate-ethinyl estradiol (ORTHO-CYCLEN,SPRINTEC,PREVIFEM) 0.25-35 MG-MCG tablet  Take 1 tablet by mouth daily.           .  traMADol (ULTRAM) 50 MG tablet  Take 1 tablet (50 mg total) by mouth every 6 (six) hours as needed for pain.   20 tablet   0        Review of Systems Review of Systems  Constitutional: Negative for fever, chills and unexpected weight change.  HENT: Negative for hearing loss, congestion, sore throat, trouble swallowing and voice change.   Eyes: Negative for visual disturbance.  Respiratory: Negative for cough and wheezing.   Cardiovascular: Negative for chest pain,  palpitations and leg swelling.  Gastrointestinal: Positive for abdominal pain. Negative for nausea, vomiting, diarrhea, constipation, blood in stool, abdominal distention and anal bleeding.  Genitourinary: Positive for pelvic pain. Negative for hematuria, vaginal bleeding and difficulty urinating.  Musculoskeletal: Negative for arthralgias.  Skin: Negative for rash and wound.  Neurological: Negative for seizures, syncope and headaches.  Hematological: Negative for adenopathy. Does not bruise/bleed easily.  Psychiatric/Behavioral: Negative for confusion.      Blood pressure 124/80, pulse 88, temperature 97 F (36.1 C), resp. rate 16, height 5\' 7"  (1.702 m), weight 165 lb (74.844 kg).   Physical Exam Physical Exam WDWN in NAD HEENT:  EOMI, sclera anicteric Neck:  No masses, no thyromegaly Lungs:  CTA bilaterally; normal respiratory effort CV:  Regular rate and rhythm; no murmurs Abd:  +bowel sounds, soft, non-tender, no masses Healed RLQ incision - no sign of hernia or tenderness R groin - healed Pfannenstiel incision; tender at right edge of incision - worse with Valsalva maneuver; no palpable masses Ext:  Well-perfused; no edema Skin:  Warm, dry; no sign of jaundice   Data Reviewed RADIOLOGY REPORT*   Clinical Data: Intermittent right lower quadrant pain x 2 years,   prior appendectomy, endometriosis   CT ABDOMEN AND PELVIS WITHOUT CONTRAST   Technique: Multidetector CT imaging of the abdomen and pelvis was   performed following the standard protocol without intravenous   contrast.   Comparison: [facility] CT abdomen/pelvis dated 03/18/2009   Findings: Lung bases are clear.   Unenhanced liver, spleen, pancreas, and adrenal glands are within   normal limits.   Gallbladder is underdistended. No intrahepatic or extrahepatic   ductal dilatation.   Kidneys are unremarkable. No renal calculi or hydronephrosis.   No evidence of bowel obstruction. Prior appendectomy.   No  evidence of abdominal aortic aneurysm.   No abdominopelvic ascites.   Small retroperitoneal lymph nodes which do not meet pathologic CT   size criteria.   Uterus and  bilateral ovaries are unremarkable.   No ureteral or bladder calculi. Bladder is unremarkable.   Mild fullness of the right inferior rectus muscle (series 3/image   78). This is more pronounced than on the prior, although it could   reflect interval postsurgical changes.   Mild degenerative changes of the visualized thoracolumbar spine.   IMPRESSION:   Prior appendectomy. No evidence of bowel obstruction.   No renal, ureteral, or bladder calculi. No hydronephrosis.   Mild fullness of the right inferior rectus muscle, likely   postsurgical or an anatomic variant. If this corresponds to a   palpable abnormality, consider pelvic MRI (with/without contrast)   for further characterization.   Otherwise, no CT findings to account for the patient's chronic   right lower quadrant abdominal pain.a   Original Report Authenticated By: Charline Bills, M.D.  RADIOLOGY REPORT*  Clinical Data: History of right inguinal pain around lateral side  of C-section scar. Prior appendectomy and laparoscopic surgery in  this area. Pain started 2 months ago. Rule out hernia. Prior CT  describing a history of endometriosis.  MRI PELVIS WITHOUT AND WITH CONTRAST  Technique: Multiplanar multisequence MR imaging of the pelvis was  performed both before and after administration of intravenous  contrast.  Contrast: 15mL MULTIHANCE GADOBENATE DIMEGLUMINE 529 MG/ML IV SOLN  Comparison: 12/24/2011 CT.  Findings: Normal pelvic bowel loops. Normal urinary bladder.  Nonspecific ill definition of the junctional zone within the  uterus. Normal endometrium.  Both ovaries are identified and are within normal limits. Example  image 11/series 5. No free pelvic fluid or pelvic adenopathy.  Corresponding to the abnormality described on CT, is soft tissue   fullness involving the inferior aspect of the right rectus muscle.  This is isointense to the remainder of the muscles on T1-weighted  imaging. Subtly T2 hyperintense on image 17 and 18/series 6.  After contrast administration, demonstrates moderate post contrast  enhancement. Example image 17/series 10.  The palpable abnormality is positioned along the lateral aspect of  this rectus muscle. There is no underlying hernia identified.  IMPRESSION:  Asymmetric soft tissue fullness with subtle abnormal T2 signal and  mild to moderate hyperenhancement involving the inferior right  rectus muscle. 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 Giving the history of endometriosis described on  the prior CT, abdominal wall endometriosis would be an alternate  explanation.  These results will be called to the ordering clinician or  representative by the Radiologist Assistant, and communication  documented in the PACS Dashboard.  Original Report Authenticated By: Jeronimo Greaves, M.D.     Assessment   Right rectus mass - 2 cm. This could represent an endometrioma, as the symptoms change with her menstrual cycle.  This could also represent an dermoid tumor      Plan    Excision of mass - right rectus muscle - 2 cm subfascial. The surgical procedure has been discussed with the patient.  Potential risks, benefits, alternative treatments, and expected outcomes have been explained.  All of the patient's questions at this time have been answered.  The likelihood of reaching the patient's treatment goal is good.  The patient understand the proposed surgical procedure and wishes to proceed.           Wilmon Arms. Corliss Skains, MD, [facility]  03/07/2012 11:57 AM

## 2012-03-15 NOTE — Anesthesia Postprocedure Evaluation (Signed)
  Anesthesia Post-op Note  Patient: Terri Mora [facility]  Procedure(s) Performed: Procedure(s) (LRB) with comments: EXCISION MASS (Right) - excision mass-right rectus muscle, possible mesh repair  Patient Location: PACU  Anesthesia Type:General  Level of Consciousness: awake, alert , oriented and patient cooperative  Airway and Oxygen Therapy: Patient Spontanous Breathing  Post-op Pain: mild  Post-op Assessment: Post-op Vital signs reviewed, Patient's Cardiovascular Status Stable, Respiratory Function Stable, Patent Airway, No signs of Nausea or vomiting and Pain level controlled  Post-op Vital Signs: Reviewed and stable  Complications: No apparent anesthesia complications

## 2012-03-16 ENCOUNTER — Encounter (HOSPITAL_BASED_OUTPATIENT_CLINIC_OR_DEPARTMENT_OTHER): Payer: Self-pay | Admitting: Surgery

## 2012-03-16 LAB — POCT HEMOGLOBIN-HEMACUE: Hemoglobin: 13.4 g/dL (ref 12.0–15.0)

## 2012-04-12 ENCOUNTER — Ambulatory Visit (INDEPENDENT_AMBULATORY_CARE_PROVIDER_SITE_OTHER): Payer: Commercial Managed Care - PPO | Admitting: Surgery

## 2012-04-12 ENCOUNTER — Encounter (INDEPENDENT_AMBULATORY_CARE_PROVIDER_SITE_OTHER): Payer: Self-pay | Admitting: Surgery

## 2012-04-12 VITALS — BP 122/74 | HR 84 | Temp 98.6°F | Resp 16 | Ht 66.5 in | Wt 168.0 lb

## 2012-04-12 DIAGNOSIS — R1903 Right lower quadrant abdominal swelling, mass and lump: Secondary | ICD-10-CM

## 2012-04-12 NOTE — Progress Notes (Signed)
Status post excision of a mass from the right rectus muscle on 03/15/12. 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 The patient is doing very well. She has no pain. No shortness around her incision. Her incision is well healed with no sign of infection. She does have some firm scar tissue under her incision but this is not causing any tenderness. She is eager to return to full duty. There's no sign of hernia at this time. She may resume full activity and may followup with [place] as needed. She had questions about stopping her oral contraceptives but I encouraged her to speak with her gynecologist about this. She has had a menstrual period without any recurrence of her pain.  Wilmon Arms. Corliss Skains, MD, [facility]  04/12/2012 9:12 AM

## 2012-04-27 ENCOUNTER — Encounter (INDEPENDENT_AMBULATORY_CARE_PROVIDER_SITE_OTHER): Payer: Self-pay

## 2012-12-01 ENCOUNTER — Other Ambulatory Visit: Payer: Self-pay | Admitting: Otolaryngology

## 2012-12-01 DIAGNOSIS — K146 Glossodynia: Secondary | ICD-10-CM

## 2012-12-01 DIAGNOSIS — K1120 Sialoadenitis, unspecified: Secondary | ICD-10-CM

## 2012-12-10 ENCOUNTER — Ambulatory Visit
Admission: RE | Admit: 2012-12-10 | Discharge: 2012-12-10 | Disposition: A | Discharge status: Home / Self Care | Payer: Commercial Managed Care - PPO | Source: Ambulatory Visit | Attending: Otolaryngology | Admitting: Otolaryngology

## 2012-12-10 DIAGNOSIS — K1120 Sialoadenitis, unspecified: Secondary | ICD-10-CM

## 2012-12-10 DIAGNOSIS — K146 Glossodynia: Secondary | ICD-10-CM

## 2012-12-10 MED ORDER — GADOBENATE DIMEGLUMINE 529 MG/ML IV SOLN
15.0000 mL | Freq: Once | INTRAVENOUS | Status: AC | PRN
  Administered 2012-12-10: 15 mL via INTRAVENOUS

## 2012-12-11 ENCOUNTER — Other Ambulatory Visit: Payer: Self-pay | Admitting: Otolaryngology

## 2012-12-11 DIAGNOSIS — K146 Glossodynia: Secondary | ICD-10-CM

## 2012-12-11 DIAGNOSIS — K1120 Sialoadenitis, unspecified: Secondary | ICD-10-CM

## 2012-12-28 ENCOUNTER — Ambulatory Visit: Payer: Commercial Managed Care - PPO | Admitting: Neurology

## 2013-10-29 ENCOUNTER — Other Ambulatory Visit: Payer: Self-pay | Admitting: Obstetrics and Gynecology

## 2013-10-29 DIAGNOSIS — N63 Unspecified lump in unspecified breast: Secondary | ICD-10-CM

## 2013-10-30 ENCOUNTER — Ambulatory Visit
Admission: RE | Admit: 2013-10-30 | Discharge: 2013-10-30 | Disposition: A | Discharge status: Home / Self Care | Payer: Commercial Managed Care - PPO | Source: Ambulatory Visit | Attending: Obstetrics and Gynecology | Admitting: Obstetrics and Gynecology

## 2013-10-30 ENCOUNTER — Encounter (INDEPENDENT_AMBULATORY_CARE_PROVIDER_SITE_OTHER): Payer: Self-pay

## 2013-10-30 DIAGNOSIS — N63 Unspecified lump in unspecified breast: Secondary | ICD-10-CM

## 2014-04-24 ENCOUNTER — Other Ambulatory Visit: Payer: Self-pay | Admitting: Obstetrics and Gynecology

## 2014-04-25 LAB — CYTOLOGY - PAP

## 2015-03-09 ENCOUNTER — Ambulatory Visit (INDEPENDENT_AMBULATORY_CARE_PROVIDER_SITE_OTHER): Payer: Commercial Managed Care - PPO | Admitting: Family Medicine

## 2015-03-09 VITALS — BP 136/84 | HR 76 | Temp 98.4°F | Resp 16 | Ht 67.5 in | Wt 174.4 lb

## 2015-03-09 DIAGNOSIS — R599 Enlarged lymph nodes, unspecified: Secondary | ICD-10-CM | POA: Diagnosis not present

## 2015-03-09 DIAGNOSIS — E010 Iodine-deficiency related diffuse (endemic) goiter: Secondary | ICD-10-CM

## 2015-03-09 DIAGNOSIS — R5383 Other fatigue: Secondary | ICD-10-CM | POA: Diagnosis not present

## 2015-03-09 DIAGNOSIS — E559 Vitamin D deficiency, unspecified: Secondary | ICD-10-CM

## 2015-03-09 DIAGNOSIS — E049 Nontoxic goiter, unspecified: Secondary | ICD-10-CM

## 2015-03-09 DIAGNOSIS — R590 Localized enlarged lymph nodes: Secondary | ICD-10-CM

## 2015-03-09 LAB — POCT CBC
GRANULOCYTE PERCENT: 62.4 % (ref 37–80)
HCT, POC: 41.4 % (ref 37.7–47.9)
Hemoglobin: 14.5 g/dL (ref 12.2–16.2)
LYMPH, POC: 0.4 — AB (ref 0.6–3.4)
MCH, POC: 30.4 pg (ref 27–31.2)
MCHC: 35 g/dL (ref 31.8–35.4)
MCV: 87 fL (ref 80–97)
MID (cbc): 0.4 (ref 0–0.9)
MPV: 6.8 fL (ref 0–99.8)
PLATELET COUNT, POC: 358 10*3/uL (ref 142–424)
POC Granulocyte: 3.7 (ref 2–6.9)
POC LYMPH %: 31.1 % (ref 10–50)
POC MID %: 6.5 %M (ref 0–12)
RBC: 4.75 M/uL (ref 4.04–5.48)
RDW, POC: 12.4 %
WBC: 6 10*3/uL (ref 4.6–10.2)

## 2015-03-09 LAB — COMPREHENSIVE METABOLIC PANEL
ALT: 13 U/L (ref 6–29)
AST: 62 U/L — AB (ref 10–35)
Albumin: 4.9 g/dL (ref 3.6–5.1)
Alkaline Phosphatase: 46 U/L (ref 33–115)
BILIRUBIN TOTAL: 0.7 mg/dL (ref 0.2–1.2)
BUN: 9 mg/dL (ref 7–25)
CHLORIDE: 100 mmol/L (ref 98–110)
CO2: 29 mmol/L (ref 20–31)
CREATININE: 0.65 mg/dL (ref 0.50–1.10)
Calcium: 10 mg/dL (ref 8.6–10.2)
Glucose, Bld: 101 mg/dL — ABNORMAL HIGH (ref 65–99)
Potassium: 4.5 mmol/L (ref 3.5–5.3)
SODIUM: 138 mmol/L (ref 135–146)
TOTAL PROTEIN: 7.9 g/dL (ref 6.1–8.1)

## 2015-03-09 LAB — C-REACTIVE PROTEIN: CRP: <0.5 mg/dL (ref <0.60)

## 2015-03-09 LAB — THYROID PANEL WITH TSH
Free Thyroxine Index: 2.6 (ref 1.4–3.8)
T3 Uptake: 30 % (ref 22–35)
T4 TOTAL: 8.8 ug/dL (ref 4.5–12.0)
TSH: 0.749 u[IU]/mL (ref 0.350–4.500)

## 2015-03-09 LAB — VITAMIN B12: Vitamin B-12: 739 pg/mL (ref 211–911)

## 2015-03-09 LAB — FERRITIN: Ferritin: 24 ng/mL (ref 10–291)

## 2015-03-09 LAB — POCT SEDIMENTATION RATE: POCT SED RATE: 46 mm/h — AB (ref 0–22)

## 2015-03-09 MED ORDER — VALACYCLOVIR HCL 1 G PO TABS: 1000.0000 mg | ORAL_TABLET | Freq: Three times a day (TID) | ORAL | Status: DC

## 2015-03-09 MED ORDER — TRAMADOL HCL 50 MG PO TABS: 50.0000 mg | ORAL_TABLET | Freq: Three times a day (TID) | ORAL | Status: AC | PRN

## 2015-03-09 NOTE — Patient Instructions (Signed)
Lymphadenopathy Lymphadenopathy refers to swollen or enlarged lymph glands, also called lymph nodes. Lymph glands are part of your body's defense (immune) system, which protects the body from infections, germs, and diseases. Lymph glands are found in many locations in your body, including the neck, underarm, and groin.  Many things can cause lymph glands to become enlarged. When your immune system responds to germs, such as viruses or bacteria, infection-fighting cells and fluid build up. This causes the glands to grow in size. Usually, this is not something to worry about. The swelling and any soreness often go away without treatment. However, swollen lymph glands can also be caused by a number of diseases. Your health care provider may do various tests to help determine the cause. If the cause of your swollen lymph glands cannot be found, it is important to monitor your condition to make sure the swelling goes away. HOME CARE INSTRUCTIONS Watch your condition for any changes. The following actions may help to lessen any discomfort you are feeling:  Get plenty of rest.  Take medicines only as directed by your health care provider. Your health care provider may recommend over-the-counter medicines for pain.  Apply moist heat compresses to the site of swollen lymph nodes as directed by your health care provider. This can help reduce any pain.  Check your lymph nodes daily for any changes.  Keep all follow-up visits as directed by your health care provider. This is important. SEEK MEDICAL CARE IF:  Your lymph nodes are still swollen after 2 weeks.  Your swelling increases or spreads to other areas.  Your lymph nodes are hard, seem fixed to the skin, or are growing rapidly.  Your skin over the lymph nodes is red and inflamed.  You have a fever.  You have chills.  You have fatigue.  You develop a sore throat.  You have abdominal pain.  You have weight loss.  You have night  sweats. SEEK IMMEDIATE MEDICAL CARE IF:  You notice fluid leaking from the area of the enlarged lymph node.  You have severe pain in any area of your body.  You have chest pain.  You have shortness of breath.   This information is not intended to replace advice given to you by your health care provider. Make sure you discuss any questions you have with your health care provider.   Document Released: 12/30/2007 Document Revised: 04/12/2014 Document Reviewed: 10/25/2013 Elsevier Interactive Patient Education 2016 [employer] Shingles Shingles, which is also known as herpes zoster, is an infection that causes a painful skin rash and fluid-filled blisters. Shingles is not related to genital herpes, which is a sexually transmitted infection.   Shingles only develops in people who: Have had chickenpox. Have received the chickenpox vaccine. (This is rare.) CAUSES Shingles is caused by varicella-zoster virus (VZV). This is the same virus that causes chickenpox. After exposure to VZV, the virus stays in the body in an inactive (dormant) state. Shingles develops if the virus reactivates. This can happen many years after the initial exposure to VZV. It is not known what causes this virus to reactivate. RISK FACTORS People who have had chickenpox or received the chickenpox vaccine are at risk for shingles. Infection is more common in people who: Are older than age 45. Have a weakened defense (immune) system, such as those with HIV, AIDS, or cancer. Are taking medicines that weaken the immune system, such as transplant medicines. Are under great stress. SYMPTOMS Early symptoms of this condition include itching,  tingling, and pain in an area on your skin. Pain may be described as burning, stabbing, or throbbing. A few days or weeks after symptoms start, a painful red rash appears, usually on one side of the body in a bandlike or beltlike pattern. The rash eventually turns into fluid-filled  blisters that break open, scab over, and dry up in about 2-3 weeks. At any time during the infection, you may also develop: A fever. Chills. A headache. An upset stomach. DIAGNOSIS This condition is diagnosed with a skin exam. Sometimes, skin or fluid samples are taken from the blisters before a diagnosis is made. These samples are examined under a microscope or sent to a lab for testing. TREATMENT There is no specific cure for this condition. Your health care provider will probably prescribe medicines to help you manage pain, recover more quickly, and avoid long-term problems. Medicines may include: Antiviral drugs. Anti-inflammatory drugs. Pain medicines. If the area involved is on your face, you may be referred to a specialist, such as an eye doctor (ophthalmologist) or an ear, nose, and throat (ENT) doctor to help you avoid eye problems, chronic pain, or disability. HOME CARE INSTRUCTIONS Medicines Take medicines only as directed by your health care provider. Apply an anti-itch or numbing cream to the affected area as directed by your health care provider. Blister and Rash Care Take a cool bath or apply cool compresses to the area of the rash or blisters as directed by your health care provider. This may help with pain and itching. Keep your rash covered with a loose bandage (dressing). Wear loose-fitting clothing to help ease the pain of material rubbing against the rash. Keep your rash and blisters clean with mild soap and cool water or as directed by your health care provider. Check your rash every day for signs of infection. These include redness, swelling, and pain that lasts or increases. Do not pick your blisters. Do not scratch your rash. General Instructions Rest as directed by your health care provider. Keep all follow-up visits as directed by your health care provider. This is important. Until your blisters scab over, your infection can cause chickenpox in people who have  never had it or been vaccinated against it. To prevent this from happening, avoid contact with other people, especially: Babies. Pregnant women. Children who have eczema. Elderly people who have transplants. People who have chronic illnesses, such as leukemia or AIDS. SEEK MEDICAL CARE IF: Your pain is not relieved with prescribed medicines. Your pain does not get better after the rash heals. Your rash looks infected. Signs of infection include redness, swelling, and pain that lasts or increases. SEEK IMMEDIATE MEDICAL CARE IF: The rash is on your face or nose. You have facial pain, pain around your eye area, or loss of feeling on one side of your face. You have ear pain or you have ringing in your ear. You have loss of taste. Your condition gets worse.   This information is not intended to replace advice given to you by your health care provider. Make sure you discuss any questions you have with your health care provider.   Document Released: 03/22/2005 Document Revised: 04/12/2014 Document Reviewed: 01/31/2014 Elsevier Interactive Patient Education [employer]

## 2015-03-09 NOTE — Progress Notes (Addendum)
Subjective:  This chart was scribed for Norberto Sorenson, MD by Stann Ore, Medical Scribe. This patient was seen in Room 12 and the patient's care was started 1:24 PM.   Patient ID: Terri Mora, female    DOB: Sep 14, 1970, 44 y.o.   MRN: [id number] Chief Complaint  Patient presents with  . thyroid issue    has pain / swelling of thyroid x 5 days  . Fatigue   HPI Terri Mora is a 44 y.o. female who presents to [facility] complaining of gradual onset swelling of her neck noticed 5 days ago. She states that her throat hurts, describing it as a waxing & waning burning pain. She's applied ice to the area with some relief. She saw a chiropractor for adjustment 3 days ago as she also had some neck pain. She denies trouble swallowing, sore throat and rash. She denies urinary symptoms. She denies feeling "sick".   She reports that around 2011, she's been having headaches. In 2014, she developed, what they thought initially, trigeminal myalgia, with pain radiating across her cheeks. She had cheek muscle spasms, headband headache pain and metallic taste in her mouth. She saw ENT and neurology for this. She had MRI of her neck and her brain. She was told her symptoms didn't fit any particular diagnosis and was started muscle relaxant and gabapentin. She continued to have symptoms but then went to chiropractor which she was very reluctant to do, [employer]. She was told she had significant tightening and disalignment of her occiput on C-1. And after her first adjustment, noticed immediate improvement. Since then, her symptoms have waxed and waned, but does notice improvement from chiropractor manipulation. She denies seeing orthopedic and has not had imaging since c-1 xray.   Fatigue She's been feeling fatigue and "foggy" for about a year. It has been slightly worse throughout the year. She was also vitamin D deficient with some hair loss and thinning and had to take supplements. She denies having a PCP.  Pt becomes very tearful when discussing the fatigue and fogginess she has experienced over the past year. She states that even if any finding is borderline, she wants to pursue or treat it due to symptoms severity.   She notes having severe claustrophobia so can only do open MRI. She felt a lot more depressed in past year.   She notes history of blood clots.  Her periods have been getting shorter. They would be really heavy for a few days and then even out.   Past Medical History  Diagnosis Date  . Headache(784.0)     hormonal  . History of kidney stones   . TMJ (temporomandibular joint syndrome)   . Cough 03/10/2012  . Laryngitis 03/06/2012  . Dental filling status     states has a filling upper front tooth  . Muscle mass 03/2012    right rectus muscle  . Dysrhythmia     PAC's - worse with caffeine use and stress; has not seen cardiologist in > 3 yr.   Prior to Admission medications   Not on File   Allergies  Allergen Reactions  . Ivp Dye [Iodinated Diagnostic Agents] Other (See Comments)    UNKNOWN   Review of Systems  Constitutional: Positive for fatigue. Negative for fever, chills and diaphoresis.  HENT: Negative for sore throat and trouble swallowing.   Respiratory: Negative for choking.   Gastrointestinal: Negative for vomiting and constipation.  Genitourinary: Negative for dysuria, urgency and frequency.  Musculoskeletal: Positive  for neck pain.  Skin: Negative for rash.       Objective:   Physical Exam  Constitutional: She is oriented to person, place, and time. She appears well-developed and well-nourished. No distress.  HENT:  Head: Normocephalic and atraumatic.  Mouth/Throat: Posterior oropharyngeal erythema present.  Eyes: EOM are normal. Pupils are equal, round, and reactive to light.  Neck: Neck supple. Thyromegaly (Question enlargement right thyroid lobe versus adenopathy) present.  Cardiovascular: Normal rate.   Pulmonary/Chest: Effort normal. No  respiratory distress.  Musculoskeletal: Normal range of motion.  Lymphadenopathy:       Head (right side): Submandibular adenopathy present.       Head (left side): Submandibular adenopathy present.    She has cervical adenopathy (Anterior cervical adenopathy, No posterior cervical adenopathy).       Right: No supraclavicular adenopathy present.       Left: No supraclavicular adenopathy present.  Positive submandibular, right worse than left No occipital nodes  Neurological: She is alert and oriented to person, place, and time.  Skin: Skin is warm and dry.  Psychiatric: She has a normal mood and affect. Her behavior is normal.  Nursing note and vitals reviewed.   BP 136/84 mmHg  Pulse 76  Temp(Src) 98.4 F (36.9 C) (Oral)  Resp 16  Ht 5' 7.5" (1.715 m)  Wt 174 lb 6.4 oz (79.107 kg)  BMI 26.90 kg/m2  SpO2 99%  LMP 03/02/2015  Results for orders placed or performed in visit on 03/09/15  POCT CBC  Result Value Ref Range   WBC 6.0 4.6 - 10.2 K/uL   Lymph, poc 0.4 (A) 0.6 - 3.4   POC LYMPH PERCENT 31.1 10 - 50 %L   MID (cbc) 0.4 0 - 0.9   POC MID % 6.5 0 - 12 %M   POC Granulocyte 3.7 2 - 6.9   Granulocyte percent 62.4 37 - 80 %G   RBC 4.75 4.04 - 5.48 M/uL   Hemoglobin 14.5 12.2 - 16.2 g/dL   HCT, POC 14.7 82.9 - 47.9 %   MCV 87.0 80 - 97 fL   MCH, POC 30.4 27 - 31.2 pg   MCHC 35.0 31.8 - 35.4 g/dL   RDW, POC 56.2 %   Platelet Count, POC 358 142 - 424 K/uL   MPV 6.8 0 - 99.8 fL       Assessment & Plan:   1. Other fatigue   2. Anterior cervical adenopathy   3. Thyromegaly   4. Vitamin D deficiency   I am unsure if the tenderness and swelling in pt's right anterior neck is stemming from her thyroid or lymphnodes so will get [place] - due to severity of sxs and pain will try to get done asap tomorrow and pt can f/u in clinic with me in 2d to review [place] and labs.  Reviewed poss of acute viral illness, exacerbation of prior viral illness of shingles or EBV as etiology so will  cover with high dose valtrex. DDx also includes thyroiditis though less likely due to normal vitals - could be Hashimotos/Graves.  If [place] and lab w/u on revealing, would want to proceed with c-spine imaging next - last xray was several years ago with chiropractor so would prob need to repeat and then get MRI to evaluate for C1 radiculopathy.  Orders Placed This Encounter  Procedures  . US Soft Tissue Head/Neck    Standing Status: Future     Number of Occurrences:      Standing  Expiration Date: 05/09/2016    Order Specific Question:  Reason for Exam (SYMPTOM  OR DIAGNOSIS REQUIRED)    Answer:  new tender right thyromegaly vs right cervical adenopathy    Order Specific Question:  Preferred imaging location?    Answer:  [facility]  . Comprehensive metabolic panel  . VITAMIN D 25 Hydroxy (Vit-D Deficiency, Fractures)  . Vitamin B12  . Ferritin  . C-reactive protein  . Thyroid Panel With TSH  . POCT CBC  . POCT SEDIMENTATION RATE    Meds ordered this encounter  Medications  . valACYclovir (VALTREX) 1000 MG tablet    Sig: Take 1 tablet (1,000 mg total) by mouth 3 (three) times daily.    Dispense:  21 tablet    Refill:  0  . traMADol (ULTRAM) 50 MG tablet    Sig: Take 1 tablet (50 mg total) by mouth every 8 (eight) hours as needed.    Dispense:  30 tablet    Refill:  0   Over 45 min spent in face-to-face evaluation of and consultation with patient and coordination of care.  Over 50% of this time was spent counseling this patient.   I personally performed the services described in this documentation, which was scribed in my presence. The recorded information has been reviewed and considered, and addended by me as needed.  Norberto SorensonEva Shaw, MD MPH  By signing my name below, I, Stann Oresung-Kai Tsai, attest that this documentation has been prepared under the direction and in the presence of Norberto SorensonEva Shaw, MD. Electronically Signed: Stann Oresung-Kai Tsai, Scribe. 03/09/2015 , 1:24 PM .

## 2015-03-10 ENCOUNTER — Ambulatory Visit
Admission: RE | Admit: 2015-03-10 | Discharge: 2015-03-10 | Disposition: A | Discharge status: Home / Self Care | Payer: Commercial Managed Care - PPO | Source: Ambulatory Visit | Attending: Family Medicine | Admitting: Family Medicine

## 2015-03-10 ENCOUNTER — Encounter (INDEPENDENT_AMBULATORY_CARE_PROVIDER_SITE_OTHER): Payer: Self-pay

## 2015-03-10 DIAGNOSIS — R5383 Other fatigue: Secondary | ICD-10-CM

## 2015-03-10 DIAGNOSIS — E010 Iodine-deficiency related diffuse (endemic) goiter: Secondary | ICD-10-CM

## 2015-03-10 DIAGNOSIS — R590 Localized enlarged lymph nodes: Secondary | ICD-10-CM

## 2015-03-10 LAB — VITAMIN D 25 HYDROXY (VIT D DEFICIENCY, FRACTURES): VIT D 25 HYDROXY: 26 ng/mL — AB (ref 30–100)

## 2015-03-11 ENCOUNTER — Ambulatory Visit (INDEPENDENT_AMBULATORY_CARE_PROVIDER_SITE_OTHER): Payer: Commercial Managed Care - PPO | Admitting: Family Medicine

## 2015-03-11 ENCOUNTER — Ambulatory Visit (INDEPENDENT_AMBULATORY_CARE_PROVIDER_SITE_OTHER): Payer: Commercial Managed Care - PPO

## 2015-03-11 ENCOUNTER — Encounter: Payer: Self-pay | Admitting: Family Medicine

## 2015-03-11 VITALS — BP 136/80 | HR 83 | Temp 98.6°F | Resp 16

## 2015-03-11 DIAGNOSIS — E559 Vitamin D deficiency, unspecified: Secondary | ICD-10-CM

## 2015-03-11 DIAGNOSIS — M542 Cervicalgia: Secondary | ICD-10-CM | POA: Diagnosis not present

## 2015-03-11 DIAGNOSIS — R700 Elevated erythrocyte sedimentation rate: Secondary | ICD-10-CM

## 2015-03-11 DIAGNOSIS — M532X1 Spinal instabilities, occipito-atlanto-axial region: Secondary | ICD-10-CM

## 2015-03-11 DIAGNOSIS — R5382 Chronic fatigue, unspecified: Secondary | ICD-10-CM

## 2015-03-11 LAB — C-REACTIVE PROTEIN: CRP: <0.5 mg/dL (ref <0.60)

## 2015-03-11 MED ORDER — VITAMIN D (ERGOCALCIFEROL) 1.25 MG (50000 UNIT) PO CAPS: 50000.0000 [IU] | ORAL_CAPSULE | ORAL | Status: AC

## 2015-03-11 MED ORDER — METHOCARBAMOL 500 MG PO TABS: 500.0000 mg | ORAL_TABLET | Freq: Four times a day (QID) | ORAL | Status: AC | PRN

## 2015-03-11 NOTE — Progress Notes (Signed)
Subjective:    Patient ID: Terri Mora, female    DOB: Jul 11, 1970, 44 y.o.   MRN: [id number] By signing my name below, I, Littie Deedsichard Sun, attest that this documentation has been prepared under the direction and in the presence of Norberto SorensonEva Shaw, MD.  Electronically Signed: Littie Deedsichard Sun, Medical Scribe. 03/11/2015. 11:01 AM.  Chief Complaint  Patient presents with  . Follow-up    for US; lab    HPI HPI Comments: Terri Mora is a 44 y.o. female who presents to the [facility] for a follow-up. She was here 2 days ago with severe pain around the right aspect of her neck. Complaining of thyroid enlargement. She had had increasing symptoms of fatigue, weight gain, depression, and mental fogginess over the past year, with 5 days of severe neck pain. 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 Finally, she visited a chiropractor who adjusted C1 leading to almost immediate improvement in her symptoms. She had had a chiropractor adjustment several days before the pain started. Reports her C-spine XRs were last done about 2 years ago. I did have her start on Valtrex 1 g TID due to possibility of developing shingles. However, all US and lab work returned relatively benign, so patient was requested to return to clinic today for repeat imaging of her C-spine.   She has not seen a rheumatologist. Her neck pain has remained about the same since she was here 2 days ago. She has been icing her neck which has provided some relief. The pain is worse with heat and worsens as the day goes on. Patient reports having an unusual sensation to the right side of her neck. She also reports having some weakness going down her right arm, described as a muscle tightness; she feels as if the muscles are clenching the nerves. Patient denies numbness.  Past Medical History  Diagnosis Date  .  Headache(784.0)     hormonal  . History of kidney stones   . TMJ (temporomandibular joint syndrome)   . Cough 03/10/2012  . Laryngitis 03/06/2012  . Dental filling status     states has a filling upper front tooth  . Muscle mass 03/2012    right rectus muscle  . Dysrhythmia     PAC's - worse with caffeine use and stress; has not seen cardiologist in > 3 yr.   Past Surgical History  Procedure Laterality Date  . Cesarean section  11/02/2006; 07/07/2000    x 3 total  . Cystoscopy with retrograde pyelogram, ureteroscopy and stent placement    . Appendectomy  age 44  . Cystoscopy w/ ureteral stent removal    . Wisdom tooth extraction    . Colonoscopy    . Laparoscopy  03/05/2011    Procedure: LAPAROSCOPY OPERATIVE;  Surgeon: Juluis MireJohn S McComb;  Location: [facility];  Service: Gynecology;  Laterality: N/A;  Ablation of endometriosis, Open laparoscopy  . Mass excision  03/15/2012    Procedure: EXCISION MASS;  Surgeon: Wilmon ArmsMatthew K. Tsuei, MD;  Location: [facility];  Service: General;  Laterality: Right;  excision mass-right rectus muscle, possible mesh repair   Current Outpatient Prescriptions on File Prior to Visit  Medication Sig Dispense Refill  . traMADol (ULTRAM) 50 MG tablet Take 1 tablet (50 mg total) by mouth every 8 (eight) hours as needed. 30 tablet 0  . valACYclovir (VALTREX) 1000  MG tablet Take 1 tablet (1,000 mg total) by mouth 3 (three) times daily. 21 tablet 0   No current facility-administered medications on file prior to visit.   Allergies  Allergen Reactions  . Ivp Dye [Iodinated Diagnostic Agents] Other (See Comments)    UNKNOWN   Family History  Problem Relation Age of Onset  . Hyperlipidemia Mother   . Cancer Father   . Heart disease Father   . Hyperlipidemia Father   . Anesthesia problems Brother     asthma attack under anesthesia as a small child   Social History   Social History  . Marital Status: Married    Spouse Name: N/A  . Number of Children:  N/A  . Years of Education: N/A   Social History Main Topics  . Smoking status: Never Smoker   . Smokeless tobacco: Never Used  . Alcohol Use: Yes  . Drug Use: No  . Sexual Activity: Not Asked   Other Topics Concern  . None   Social History Narrative    Review of Systems  Constitutional: Positive for fatigue.  Musculoskeletal: Positive for neck pain.  Neurological: Positive for weakness. Negative for numbness.  Psychiatric/Behavioral: Positive for dysphoric mood.       Objective:  BP 136/80 mmHg  Pulse 83  Temp(Src) 98.6 F (37 C) (Oral)  Resp 16  SpO2 98%  LMP 03/02/2015  Physical Exam  Constitutional: She is oriented to person, place, and time. She appears well-developed and well-nourished. No distress.  HENT:  Head: Normocephalic and atraumatic.  Mouth/Throat: Oropharynx is clear and moist. No oropharyngeal exudate.  Eyes: Pupils are equal, round, and reactive to light.  Neck: Neck supple.  No point tenderness over C-spine or paraspinals. Clavicles stable. Palpable tightness of the left SCM. Palpable tightness at the base of the occiput.  Cardiovascular: Normal rate.   Pulmonary/Chest: Effort normal.  Musculoskeletal: She exhibits no edema.  Lymphadenopathy:       Right: No supraclavicular adenopathy present.       Left: No supraclavicular adenopathy present.  Neurological: She is alert and oriented to person, place, and time. No cranial nerve deficit.  Reflex Scores:      Tricep reflexes are 2+ on the right side and 2+ on the left side.      Bicep reflexes are 2+ on the right side and 2+ on the left side.      Brachioradialis reflexes are 2+ on the right side and 2+ on the left side. Skin: Skin is warm and dry. No rash noted.  Psychiatric: She has a normal mood and affect. Her behavior is normal.  Nursing note and vitals reviewed.  [facility] (PRIMARY) x-ray report read by Dr. Clelia Croft: Cervical spine - Loss of the cervical lordosis. No other significant abnormalities  seen.  Dg Cervical Spine 2 Or 3 Views  03/11/2015  CLINICAL DATA:  Right neck pain.  No known injury. EXAM: CERVICAL SPINE - 2-3 VIEW COMPARISON:  None. FINDINGS: Loss of normal cervical lordosis. No subluxation or fracture. Prevertebral soft tissues are normal. Disc spaces are maintained. IMPRESSION: Cervical straightening.  No acute bony abnormality. Electronically Signed   By: Charlett Nose M.D.   On: 03/11/2015 12:13   US Soft Tissue Head/neck  03/10/2015  CLINICAL DATA:  New right-sided tender thyromegaly versus cervical adenopathy for the past week. EXAM: THYROID ULTRASOUND TECHNIQUE: Ultrasound examination of the thyroid gland and adjacent soft tissues was performed. COMPARISON:  Neck MRI - 12/10/2012 FINDINGS: Right thyroid lobe Measurements: Normal  in size measuring 6.9 x 1.3 x 2.1 cm. There are several scattered punctate (sub 5 mm) anechoic and thus presumably cystic nodules scattered throughout the right lobe of the thyroid. Left thyroid lobe Measurements: Normal in size measuring 6.6 x 1.6 x 2.1 cm. There are several scattered punctate (sub 5 mm) anechoic and thus presumably cystic nodules scattered within the left lobe of the thyroid. Isthmus Thickness: Normal in size measuring 0.3 cm in diameter. No discrete nodule or mass is identified within the thyroid isthmus. Lymphadenopathy None visualized. IMPRESSION: 1. No explanation for patient's right-sided neck pain. Specifically, no evidence of asymmetric thyromegaly or discrete thyroid mass. 2. Scattered punctate (sub 5 mm) cystic nodules within the right and left lobes of the thyroid do not currently meet imaging criteria to recommend percutaneous sampling. This recommendation follows the consensus statement: Management of Thyroid Nodules Detected at [place]: Society of Radiologists in Ultrasound Consensus Conference Statement. Radiology 2005; [id number]. Electronically Signed   By: Simonne Come M.D.   On: 03/10/2015 16:56       Assessment & Plan:    1. Neck pain on right side - [place] normal so suspect this may be coming from C1-2 Rt nerve root impingement - esp due to her h/o worsening after chiropractor trx - will try to get open MRI of c-spine before end of calender yr. Refer to PT - try craniosacral or myofascial releat through [facility].  Start trial of muscle relaxant which did work prior but can't use anything that would cause to much sedation so try robaxin.  Call in 1-2 wks to see how respond and will likely want to use therapeutic trial of prednisone burst. Or could due therapeutic trial of gabapentin.  2. Instability of C1-C2 vertebrae   3. Vitamin D deficiency - restart 3 mo course of high dose  4. Chronic fatigue   5. Elevated erythrocyte sedimentation rate - pt concerned she could have lyme, lupus, MS, etc or other due to systemic chronic vague sxs, recheck    Orders Placed This Encounter  Procedures  . DG Cervical Spine 2 or 3 views    Standing Status: Future     Number of Occurrences: 1     Standing Expiration Date: 03/10/2016    Order Specific Question:  Reason for Exam (SYMPTOM  OR DIAGNOSIS REQUIRED)    Answer:  pain around right C1-c2    Order Specific Question:  Is the patient pregnant?    Answer:  No    Order Specific Question:  Preferred imaging location?    Answer:  [facility]  . MR Cervical Spine Wo Contrast    Standing Status: Future     Number of Occurrences:      Standing Expiration Date: 05/11/2016    Scheduling Instructions:     Needs OPEN MRI by the end of the year. If for some reason they can't get her in by year end, please ask pt if she would like to do the normal closed MRI with a lot of anxiety medicine on board.    Order Specific Question:  Reason for Exam (SYMPTOM  OR DIAGNOSIS REQUIRED)    Answer:  worsening sxs of C1-2 right radiculopathy, failed long-term chiropractor and anti-inflammatory trx.    Order Specific Question:  Preferred imaging location?    Answer:  [facility]     Order Specific Question:  Does the patient have a pacemaker or implanted devices?    Answer:  No    Order Specific Question:  What is the patient's sedation requirement?    Answer:  Anti-anxiety  . Sedimentation Rate  . C-reactive protein  . ANA  . Ambulatory referral to Physical Therapy    Referral Priority:  Routine    Referral Type:  Physical Medicine    Referral Reason:  Specialty Services Required    Requested Specialty:  Physical Therapy    Number of Visits Requested:  1    Meds ordered this encounter  Medications  . ibuprofen (ADVIL,MOTRIN) 400 MG tablet    Sig: Take 400 mg by mouth every 6 (six) hours as needed.  . Vitamin D, Ergocalciferol, (DRISDOL) 50000 UNITS CAPS capsule    Sig: Take 1 capsule (50,000 Units total) by mouth every 7 (seven) days.    Dispense:  12 capsule    Refill:  0  . methocarbamol (ROBAXIN) 500 MG tablet    Sig: Take 1-2 tablets (500-1,000 mg total) by mouth every 6 (six) hours as needed for muscle spasms.    Dispense:  60 tablet    Refill:  1   Over 40 min spent in face-to-face evaluation of and consultation with patient and coordination of care.  Over 50% of this time was spent counseling this patient.  I personally performed the services described in this documentation, which was scribed in my presence. The recorded information has been reviewed and considered, and addended by me as needed.  Norberto Sorenson, MD MPH

## 2015-03-11 NOTE — Patient Instructions (Addendum)
Call in 1 or 2 weeks to let me know how you are doing and if you want to try a course of prednisone to see how you respond  Iron-Rich Diet Iron is a mineral that helps your body to produce hemoglobin. Hemoglobin is a protein in your red blood cells that carries oxygen to your body's tissues. Eating too little iron may cause you to feel weak and tired, and it can increase your risk for infection. Eating enough iron is necessary for your body's metabolism, muscle function, and nervous system. Iron is naturally found in many foods. It can also be added to foods or fortified in foods. There are two types of dietary iron:  Heme iron. Heme iron is absorbed by the body more easily than nonheme iron. Heme iron is found in meat, poultry, and fish.  Nonheme iron. Nonheme iron is found in dietary supplements, iron-fortified grains, beans, and vegetables. You may need to follow an iron-rich diet if:  You have been diagnosed with iron deficiency or iron-deficiency anemia.  You have a condition that prevents you from absorbing dietary iron, such as:  Infection in your intestines.  Celiac disease. This involves long-lasting (chronic) inflammation of your intestines.  You do not eat enough iron.  You eat a diet that is high in foods that impair iron absorption.  You have lost a lot of blood.  You have heavy bleeding during your menstrual cycle.  You are pregnant. WHAT IS MY PLAN? Your health care provider may help you to determine how much iron you need per day based on your condition. Generally, when a person consumes sufficient amounts of iron in the diet, the following iron needs are met:  Men.  74-58 years old: 11 mg per day.  46-69 years old: 8 mg per day.  Women.   11-61 years old: 15 mg per day.  31-100 years old: 18 mg per day.  Over 33 years old: 8 mg per day.  Pregnant women: 27 mg per day.  Breastfeeding women: 9 mg per day. WHAT DO I NEED TO KNOW ABOUT AN IRON-RICH  DIET?  Eat fresh fruits and vegetables that are high in vitamin C along with foods that are high in iron. This will help increase the amount of iron that your body absorbs from food, especially with foods containing nonheme iron. Foods that are high in vitamin C include oranges, peppers, tomatoes, and mango.  Take iron supplements only as directed by your health care provider. Overdose of iron can be life-threatening. If you were prescribed iron supplements, take them with orange juice or a vitamin C supplement.  Cook foods in pots and pans that are made from iron.   Eat nonheme iron-containing foods alongside foods that are high in heme iron. This helps to improve your iron absorption.   Certain foods and drinks contain compounds that impair iron absorption. Avoid eating these foods in the same meal as iron-rich foods or with iron supplements. These include:  Coffee, black tea, and red wine.  Milk, dairy products, and foods that are high in calcium.  Beans, soybeans, and peas.  Whole grains.  When eating foods that contain both nonheme iron and compounds that impair iron absorption, follow these tips to absorb iron better.   Soak beans overnight before cooking.  Soak whole grains overnight and drain them before using.  Ferment flours before baking, such as using yeast in bread dough. WHAT FOODS CAN I EAT? Grains Iron-fortified breakfast cereal. Iron-fortified whole-wheat bread.  Enriched rice. Sprouted grains. Vegetables Spinach. Potatoes with skin. Green peas. Broccoli. Red and green bell peppers. Fermented vegetables. Fruits Prunes. Raisins. Oranges. Strawberries. Mango. Grapefruit. Meats and Other Protein Sources Beef liver. Oysters. Beef. Shrimp. [place]. Chicken. [place]. Sardines. Chickpeas. Nuts. Tofu. Beverages Tomato juice. Fresh orange juice. Prune juice. Hibiscus tea. Fortified instant breakfast shakes. Condiments Tahini. Fermented soy sauce. Sweets and  Desserts Black-strap molasses.  Other Wheat germ. The items listed above may not be a complete list of recommended foods or beverages. Contact your dietitian for more options. WHAT FOODS ARE NOT RECOMMENDED? Grains Whole grains. Bran cereal. Bran flour. Oats. Vegetables Artichokes. Brussels sprouts. Kale. Fruits Blueberries. Raspberries. Strawberries. Figs. Meats and Other Protein Sources Soybeans. Products made from soy protein. Dairy Milk. Cream. Cheese. Yogurt. Cottage cheese. Beverages Coffee. Black tea. Red wine. Sweets and Desserts Cocoa. Chocolate. Ice cream. Other Basil. Oregano. Parsley. The items listed above may not be a complete list of foods and beverages to avoid. Contact your dietitian for more information.   This information is not intended to replace advice given to you by your health care provider. Make sure you discuss any questions you have with your health care provider.   Document Released: 11/03/2004 Document Revised: 04/12/2014 Document Reviewed: 10/17/2013 Elsevier Interactive Patient Education 2016 Elsevier Inc. Cervical Radiculopathy Cervical radiculopathy happens when a nerve in the neck (cervical nerve) is pinched or bruised. This condition can develop because of an injury or as part of the normal aging process. Pressure on the cervical nerves can cause pain or numbness that runs from the neck all the way down into the arm and fingers. Usually, this condition gets better with rest. Treatment may be needed if the condition does not improve.  CAUSES This condition may be caused by:  Injury.  Slipped (herniated) disk.  Muscle tightness in the neck because of overuse.  Arthritis.  Breakdown or degeneration in the bones and joints of the spine (spondylosis) due to aging.  Bone spurs that may develop near the cervical nerves. SYMPTOMS Symptoms of this condition include:  Pain that runs from the neck to the arm and hand. The pain can  be severe or irritating. It may be worse when the neck is moved.  Numbness or weakness in the affected arm and hand. DIAGNOSIS This condition may be diagnosed based on symptoms, medical history, and a physical exam. You may also have tests, including:  X-rays.  CT scan.  MRI.  Electromyogram (EMG).  Nerve conduction tests. TREATMENT In many cases, treatment is not needed for this condition. With rest, the condition usually gets better over time. If treatment is needed, options may include:  Wearing a soft neck collar for short periods of time.  Physical therapy to strengthen your neck muscles.  Medicines, such as NSAIDs, oral corticosteroids, or spinal injections.  Surgery. This may be needed if other treatments do not help. Various types of surgery may be done depending on the cause of your problems. HOME CARE INSTRUCTIONS Managing Pain  Take over-the-counter and prescription medicines only as told by your health care provider.  If directed, apply ice to the affected area.  Put ice in a plastic bag.  Place a towel between your skin and the bag.  Leave the ice on for 20 minutes, 2-3 times per day.  If ice does not help, you can try using heat. Take a warm shower or warm bath, or use a heat pack as told by your health care provider.  Try a gentle  neck and shoulder massage to help relieve symptoms. Activity  Rest as needed. Follow instructions from your health care provider about any restrictions on activities.  Do stretching and strengthening exercises as told by your health care provider or physical therapist. General Instructions  If you were given a soft collar, wear it as told by your health care provider.  Use a flat pillow when you sleep.  Keep all follow-up visits as told by your health care provider. This is important. SEEK MEDICAL CARE IF:  Your condition does not improve with treatment. SEEK IMMEDIATE MEDICAL CARE IF:  Your pain gets much worse and  cannot be controlled with medicines.  You have weakness or numbness in your hand, arm, face, or leg.  You have a high fever.  You have a stiff, rigid neck.  You lose control of your bowels or your bladder (have incontinence).  You have trouble with walking, balance, or speaking.   This information is not intended to replace advice given to you by your health care provider. Make sure you discuss any questions you have with your health care provider.   Document Released: 12/15/2000 Document Revised: 12/11/2014 Document Reviewed: 05/16/2014 Elsevier Interactive Patient Education [employer].

## 2015-03-12 LAB — SEDIMENTATION RATE: Sed Rate: 10 mm/hr (ref 0–20)

## 2015-03-12 LAB — ANA: ANA: NEGATIVE

## 2015-03-13 ENCOUNTER — Telehealth: Payer: Self-pay

## 2015-03-13 NOTE — Telephone Encounter (Signed)
Pt called requesting lab results. Please review labs.  Thank you.  

## 2015-03-13 NOTE — Telephone Encounter (Signed)
Patient

## 2015-03-14 NOTE — Telephone Encounter (Signed)
They were all normal/negative. Will send letter in mail.

## 2015-03-14 NOTE — Telephone Encounter (Signed)
Pt notified of results

## 2015-03-24 ENCOUNTER — Ambulatory Visit
Admission: RE | Admit: 2015-03-24 | Discharge: 2015-03-24 | Disposition: A | Discharge status: Home / Self Care | Payer: Commercial Managed Care - PPO | Source: Ambulatory Visit | Attending: Family Medicine | Admitting: Family Medicine

## 2015-03-24 DIAGNOSIS — M532X1 Spinal instabilities, occipito-atlanto-axial region: Secondary | ICD-10-CM

## 2015-03-24 DIAGNOSIS — M542 Cervicalgia: Secondary | ICD-10-CM

## 2015-03-25 ENCOUNTER — Encounter: Payer: Self-pay | Admitting: Family Medicine

## 2015-03-27 ENCOUNTER — Telehealth: Payer: Self-pay

## 2015-03-27 NOTE — Telephone Encounter (Signed)
Pt would like Dr. Clelia CroftShaw to call her back in regards to her recent MRI results; she would like to know what Dr. Clelia CroftShaw recommends for the future, and if she plans to refer her elsewhere.

## 2015-03-27 NOTE — Telephone Encounter (Deleted)
Pt would like Dr. Clelia CroftShaw to call her back in regards to her recent MRI results; she

## 2015-03-28 NOTE — Telephone Encounter (Signed)
Result Notes     Notes Recorded by Vira AgarMichelle L Farrington, Rad Tech on 03/25/2015 at 9:21 AM Terri DecemberSharon states that though this is good news, it still doesn't explain why she is having pain and numbness. She is also a little concerned about the narrowing and would like to know if there are any preventative measures you would recommend. ------

## 2015-03-28 NOTE — Telephone Encounter (Signed)
Keeping mobile and active are the best things - avoid activities where you are going to be bounced or thrown (mountain biking?). This develops in most people through wear and tear over time - other than making sure posture stays good, work is ergonomically set up and making sure spine and core stay strong are the best.  I had suggested [facility] trial if she is still having some symptoms - since they have therapists that work in so different modalities in 1 place they have a lot to offer that other places don't.  Would be happy to fax a copy of the report over to her chiropractor as well if she would like - and she could call the imaging place and pick up a copy of the cd so her chiropractor can look directly at the images if he would like.

## 2015-03-31 NOTE — Telephone Encounter (Signed)
Spoke with pt, advised message from Dr. Clelia CroftShaw. Pt understood. She would like her report sent to:    Jearld PiesPaul Stell,  [facility] [address] [facility]  Address: [address], [place], [place] [address]  [phone]

## 2015-03-31 NOTE — Telephone Encounter (Signed)
MRI results sent to chiropractor.

## 2015-07-09 ENCOUNTER — Encounter: Payer: Self-pay | Admitting: Podiatry

## 2015-07-09 ENCOUNTER — Ambulatory Visit (INDEPENDENT_AMBULATORY_CARE_PROVIDER_SITE_OTHER): Payer: Managed Care, Other (non HMO) | Admitting: Podiatry

## 2015-07-09 VITALS — BP 150/92 | HR 81 | Resp 14

## 2015-07-09 DIAGNOSIS — B351 Tinea unguium: Secondary | ICD-10-CM

## 2015-07-09 NOTE — Progress Notes (Signed)
   Subjective:    Patient ID: Terri Mora, female    DOB: 02-27-1971, 45 y.o.   MRN: [id number]  HPI this patient presents the office with chief complaint of fungal infected toenails, both feet. She states that it started initially as one toenail and has spread to both feet and multiple toes. She states that than nails are actually painful due to the fungus in the nails. She states that she was at her dermatologist who noted that she had liver function elevation and would not prescribe Lamisil in the meantime, she has tried as many over-the-counter and topical medications that she could, but they have all been unsuccessful. She feels the nails or worsening over time and she presents the office today to discuss further treatment of her nails    Review of Systems  All other systems reviewed and are negative.      Objective:   Physical Exam GENERAL APPEARANCE: Alert, conversant. Appropriately groomed. No acute distress.  VASCULAR: Pedal pulses are  palpable at  [facility] bilateral.  Capillary refill time is immediate to all digits,  Normal temperature gradient.  Digital hair growth is present bilateral  NEUROLOGIC: sensation is normal to 5.07 monofilament at 5/5 sites bilateral.  Light touch is intact bilateral, Muscle strength normal.  MUSCULOSKELETAL: acceptable muscle strength, tone and stability bilateral.  Intrinsic muscluature intact bilateral.  Rectus appearance of foot and digits noted bilateral.   DERMATOLOGIC: skin color, texture, and turgor are within normal limits.  No preulcerative lesions or ulcers  are seen, no interdigital maceration noted.  No open lesions present.  . No drainage noted.   NAILS  Thick disfigured discolored pliable nails both feet.  No redness or swelling noted.         Assessment & Plan:  Onychomycosis B/L   IE  Discussed the possible medications and we decided to consider jublia if needed.  I did suggest pulse dosing of lamisil with monthly  blood work to check on her liver enzymes.  She is to check with her medical doctor if that sounds good to him.  RTC 3 weeks for further evaluation and treatment.   Helane GuntherGregory Mayer DPM

## 2015-07-30 ENCOUNTER — Ambulatory Visit: Payer: Managed Care, Other (non HMO) | Admitting: Podiatry

## 2016-01-16 NOTE — H&P (Signed)
Patient name Terri Mora, Terri Mora DICTATION# [id number] CSN# [id number]  [facility]Cresta Riden S, MD 01/16/2016 8:32 AM

## 2016-01-17 NOTE — H&P (Signed)
NAME:  Terri Mora, Terri Mora           ACCOUNT NO.:  [id number]  MEDICAL RECORD NO.:  [id number]  LOCATION:                                 FACILITY:  PHYSICIAN:  Juluis MireJohn S. Ceilidh Torregrossa, M.D.        DATE OF BIRTH:  DATE OF ADMISSION: DATE OF DISCHARGE:                             HISTORY & PHYSICAL   [facility]. The date of her surgery is October 20.  The patient is a 45 year old, gravida 3, para 3 female, presents for diagnostic laparoscopy, possible removal of right tube and ovary.  The patient has been having some persistent right lower quadrant pain.  She has required a couple of emergency room visits.  We have done serial ultrasounds that had revealed a right ovarian cyst.  It has become more persistent.  It appears that it might be an endometrioma.  She has had history of endometriosis treated by laparoscopy in the past.  She also developed endometriosis and C-section incision area.  So she comes in today to undergo diagnostic laparoscopy, possible removal of right tube and ovary.  ALLERGIES:  She is allergic to IVP dye.  MEDICATIONS:  Include birth control pills.  PAST MEDICAL HISTORY:  She has a history of anemia as well as arthritis.  PAST SURGICAL HISTORY:  She had appendix removed.  She had three cesarean sections.  She had abdominal endometrioma removed and she has had a previous laparoscopy.  SOCIAL HISTORY:  Reveals no tobacco and only occasional alcohol use.  FAMILY HISTORY:  Noncontributory.  REVIEW OF SYSTEMS:  Noncontributory.  PHYSICAL EXAMINATION:  VITAL SIGNS:  The patient is afebrile. Stable vital signs. HEENT:  The patient is normocephalic.  Pupils equal, round, reactive to light and accommodation.  Extraocular movements are intact.  Sclerae and conjunctivae are clear.  Oropharynx clear. NECK:  Without thyromegaly. BREASTS:  Not examined. LUNGS:  Clear. CARDIOVASCULAR:  Regular rhythm and rate  without murmurs or gallops. ABDOMEN:  Benign.  Well-healed low-transverse incision. GU:  On pelvic, normal external genitalia.  Vaginal mucosa is clear. Cervix unremarkable.  Uterus feels to be of normal size and shape.  I do not feel any adnexal enlargement. EXTREMITIES:  Trace edema. NEUROLOGIC:  Grossly normal limits.  IMPRESSION:  Persistent right lower quadrant pain with possible endometrioma.  PLAN:  The patient to undergo diagnostic laparoscopy, possible removal of right tube and ovary.  Risk of surgery have been discussed including the risk of infection, the risk of hemorrhage that could require transfusion with the risk of AIDS or hepatitis.  Risk of injury to adjacent organs such as bladder, bowel or ureters that could require further exploratory surgery.  Risk of deep venous thrombosis and pulmonary embolus.  The patient expressed understanding of indications and risks and accepted them.     Juluis MireJohn S. Kayleana Waites, M.D.     JSM/MEDQ  D:  01/16/2016  T:  01/17/2016  Job:  [id number]

## 2016-01-20 ENCOUNTER — Encounter (HOSPITAL_BASED_OUTPATIENT_CLINIC_OR_DEPARTMENT_OTHER): Payer: Self-pay | Admitting: *Deleted

## 2016-01-20 NOTE — Progress Notes (Signed)
Pt instructed npo pmn 10/19.  [facility].  Pt needs cbc, T&S, serum HCG on arrival.[place] so she is unable to get preop labs done prior to arrival Thursday nite.

## 2016-01-23 ENCOUNTER — Encounter (HOSPITAL_BASED_OUTPATIENT_CLINIC_OR_DEPARTMENT_OTHER)
Admission: RE | Disposition: A | Discharge status: Home / Self Care | Payer: Self-pay | Source: Ambulatory Visit | Attending: Obstetrics and Gynecology

## 2016-01-23 ENCOUNTER — Encounter (HOSPITAL_BASED_OUTPATIENT_CLINIC_OR_DEPARTMENT_OTHER): Payer: Self-pay | Admitting: *Deleted

## 2016-01-23 ENCOUNTER — Ambulatory Visit (HOSPITAL_BASED_OUTPATIENT_CLINIC_OR_DEPARTMENT_OTHER): Payer: Managed Care, Other (non HMO) | Admitting: Certified Registered"

## 2016-01-23 ENCOUNTER — Ambulatory Visit (HOSPITAL_BASED_OUTPATIENT_CLINIC_OR_DEPARTMENT_OTHER)
Admission: RE | Admit: 2016-01-23 | Discharge: 2016-01-23 | Disposition: A | Discharge status: Home / Self Care | Payer: Managed Care, Other (non HMO) | Source: Ambulatory Visit | Attending: Obstetrics and Gynecology | Admitting: Obstetrics and Gynecology

## 2016-01-23 DIAGNOSIS — N838 Other noninflammatory disorders of ovary, fallopian tube and broad ligament: Secondary | ICD-10-CM | POA: Diagnosis present

## 2016-01-23 DIAGNOSIS — N801 Endometriosis of ovary: Secondary | ICD-10-CM | POA: Diagnosis not present

## 2016-01-23 DIAGNOSIS — N83201 Unspecified ovarian cyst, right side: Secondary | ICD-10-CM | POA: Diagnosis not present

## 2016-01-23 DIAGNOSIS — Z91041 Radiographic dye allergy status: Secondary | ICD-10-CM | POA: Diagnosis not present

## 2016-01-23 DIAGNOSIS — N80129 Deep endometriosis of ovary, unspecified ovary: Secondary | ICD-10-CM | POA: Diagnosis present

## 2016-01-23 HISTORY — PX: LAPAROSCOPY: SHX197

## 2016-01-23 HISTORY — DX: Presence of spectacles and contact lenses: Z97.3

## 2016-01-23 HISTORY — DX: Personal history of diseases of the skin and subcutaneous tissue: Z87.2

## 2016-01-23 HISTORY — DX: Unspecified hearing loss, unspecified ear: H91.90

## 2016-01-23 HISTORY — DX: Personal history of other diseases of the female genital tract: Z87.42

## 2016-01-23 HISTORY — DX: Anemia, unspecified: D64.9

## 2016-01-23 HISTORY — DX: Family history of other specified conditions: Z84.89

## 2016-01-23 HISTORY — PX: CYSTOSCOPY: SHX5120

## 2016-01-23 LAB — CBC
HCT: 36.2 % (ref 36.0–46.0)
HEMOGLOBIN: 12.7 g/dL (ref 12.0–15.0)
MCH: 31 pg (ref 26.0–34.0)
MCHC: 35.1 g/dL (ref 30.0–36.0)
MCV: 88.3 fL (ref 78.0–100.0)
PLATELETS: 297 10*3/uL (ref 150–400)
RBC: 4.1 MIL/uL (ref 3.87–5.11)
RDW: 12.7 % (ref 11.5–15.5)
WBC: 5.8 10*3/uL (ref 4.0–10.5)

## 2016-01-23 LAB — TYPE AND SCREEN
ABO/RH(D): A POS
ANTIBODY SCREEN: NEGATIVE

## 2016-01-23 LAB — ABO/RH: ABO/RH(D): A POS

## 2016-01-23 LAB — HCG, SERUM, QUALITATIVE: PREG SERUM: NEGATIVE

## 2016-01-23 SURGERY — LAPAROSCOPY, DIAGNOSTIC
Anesthesia: General | Site: Bladder | Laterality: Right

## 2016-01-23 MED ORDER — FENTANYL CITRATE (PF) 100 MCG/2ML IJ SOLN
INTRAMUSCULAR | Status: AC
  Filled 2016-01-23: qty 2

## 2016-01-23 MED ORDER — DEXAMETHASONE SODIUM PHOSPHATE 10 MG/ML IJ SOLN
INTRAMUSCULAR | Status: AC
  Filled 2016-01-23: qty 1

## 2016-01-23 MED ORDER — BUPIVACAINE HCL 0.25 % IJ SOLN
INTRAMUSCULAR | Status: DC | PRN
  Administered 2016-01-23: 7 mL

## 2016-01-23 MED ORDER — LIDOCAINE HCL 4 % MT SOLN
OROMUCOSAL | Status: DC | PRN
  Administered 2016-01-23: 2 mL via TOPICAL

## 2016-01-23 MED ORDER — PROPOFOL 10 MG/ML IV BOLUS
INTRAVENOUS | Status: DC | PRN
  Administered 2016-01-23: 200 mg via INTRAVENOUS

## 2016-01-23 MED ORDER — ONDANSETRON HCL 4 MG/2ML IJ SOLN
INTRAMUSCULAR | Status: AC
  Filled 2016-01-23: qty 2

## 2016-01-23 MED ORDER — PROMETHAZINE HCL 25 MG/ML IJ SOLN
6.2500 mg | INTRAMUSCULAR | Status: DC | PRN
  Filled 2016-01-23: qty 1

## 2016-01-23 MED ORDER — HYDROMORPHONE HCL 1 MG/ML IJ SOLN
0.2500 mg | INTRAMUSCULAR | Status: DC | PRN
  Administered 2016-01-23: 0.5 mg via INTRAVENOUS
  Filled 2016-01-23: qty 0.5

## 2016-01-23 MED ORDER — NEOSTIGMINE METHYLSULFATE 5 MG/5ML IV SOSY
PREFILLED_SYRINGE | INTRAVENOUS | Status: AC
  Filled 2016-01-23: qty 5

## 2016-01-23 MED ORDER — LACTATED RINGERS IV SOLN
INTRAVENOUS | Status: DC
  Administered 2016-01-23 (×2): via INTRAVENOUS
  Filled 2016-01-23: qty 1000

## 2016-01-23 MED ORDER — OXYCODONE-ACETAMINOPHEN 5-325 MG PO TABS
ORAL_TABLET | ORAL | Status: AC
  Filled 2016-01-23: qty 1

## 2016-01-23 MED ORDER — GLYCOPYRROLATE 0.2 MG/ML IV SOSY
PREFILLED_SYRINGE | INTRAVENOUS | Status: AC
  Filled 2016-01-23: qty 3

## 2016-01-23 MED ORDER — ROCURONIUM BROMIDE 50 MG/5ML IV SOSY
PREFILLED_SYRINGE | INTRAVENOUS | Status: AC
  Filled 2016-01-23: qty 5

## 2016-01-23 MED ORDER — LACTATED RINGERS IR SOLN
Status: DC | PRN
  Administered 2016-01-23: 3000 mL

## 2016-01-23 MED ORDER — NEOSTIGMINE METHYLSULFATE 5 MG/5ML IV SOSY
PREFILLED_SYRINGE | INTRAVENOUS | Status: DC | PRN
  Administered 2016-01-23: 3 mg via INTRAVENOUS

## 2016-01-23 MED ORDER — PROPOFOL 10 MG/ML IV BOLUS
INTRAVENOUS | Status: AC
  Filled 2016-01-23: qty 20

## 2016-01-23 MED ORDER — CEFAZOLIN SODIUM-DEXTROSE 2-4 GM/100ML-% IV SOLN
INTRAVENOUS | Status: AC
  Filled 2016-01-23: qty 100

## 2016-01-23 MED ORDER — SUCCINYLCHOLINE CHLORIDE 200 MG/10ML IV SOSY
PREFILLED_SYRINGE | INTRAVENOUS | Status: DC | PRN
  Administered 2016-01-23: 100 mg via INTRAVENOUS

## 2016-01-23 MED ORDER — OXYCODONE-ACETAMINOPHEN 7.5-325 MG PO TABS: 1.0000 | ORAL_TABLET | ORAL | 0 refills | Status: AC | PRN

## 2016-01-23 MED ORDER — LIDOCAINE 2% (20 MG/ML) 5 ML SYRINGE
INTRAMUSCULAR | Status: AC
  Filled 2016-01-23: qty 5

## 2016-01-23 MED ORDER — HYDROMORPHONE HCL 1 MG/ML IJ SOLN
INTRAMUSCULAR | Status: AC
  Filled 2016-01-23: qty 1

## 2016-01-23 MED ORDER — KETOROLAC TROMETHAMINE 30 MG/ML IJ SOLN
30.0000 mg | Freq: Once | INTRAMUSCULAR | Status: DC | PRN
  Filled 2016-01-23: qty 1

## 2016-01-23 MED ORDER — MIDAZOLAM HCL 2 MG/2ML IJ SOLN
INTRAMUSCULAR | Status: AC
  Filled 2016-01-23: qty 2

## 2016-01-23 MED ORDER — KETOROLAC TROMETHAMINE 30 MG/ML IJ SOLN
INTRAMUSCULAR | Status: AC
  Filled 2016-01-23: qty 1

## 2016-01-23 MED ORDER — MIDAZOLAM HCL 5 MG/5ML IJ SOLN
INTRAMUSCULAR | Status: DC | PRN
  Administered 2016-01-23: 2 mg via INTRAVENOUS

## 2016-01-23 MED ORDER — FENTANYL CITRATE (PF) 100 MCG/2ML IJ SOLN
INTRAMUSCULAR | Status: DC | PRN
  Administered 2016-01-23 (×2): 50 ug via INTRAVENOUS

## 2016-01-23 MED ORDER — CEFAZOLIN SODIUM-DEXTROSE 2-4 GM/100ML-% IV SOLN
2.0000 g | INTRAVENOUS | Status: AC
  Administered 2016-01-23: 2 g via INTRAVENOUS
  Filled 2016-01-23: qty 100

## 2016-01-23 MED ORDER — DEXAMETHASONE SODIUM PHOSPHATE 4 MG/ML IJ SOLN
INTRAMUSCULAR | Status: DC | PRN
  Administered 2016-01-23: 10 mg via INTRAVENOUS

## 2016-01-23 MED ORDER — LIDOCAINE 2% (20 MG/ML) 5 ML SYRINGE
INTRAMUSCULAR | Status: DC | PRN
  Administered 2016-01-23: 60 mg via INTRAVENOUS

## 2016-01-23 MED ORDER — GLYCOPYRROLATE 0.2 MG/ML IV SOSY
PREFILLED_SYRINGE | INTRAVENOUS | Status: DC | PRN
  Administered 2016-01-23: 0.4 mg via INTRAVENOUS

## 2016-01-23 MED ORDER — SUCCINYLCHOLINE CHLORIDE 20 MG/ML IJ SOLN
INTRAMUSCULAR | Status: AC
  Filled 2016-01-23: qty 1

## 2016-01-23 MED ORDER — OXYCODONE-ACETAMINOPHEN 5-325 MG PO TABS
1.0000 | ORAL_TABLET | Freq: Once | ORAL | Status: AC
  Administered 2016-01-23: 1 via ORAL
  Filled 2016-01-23: qty 1

## 2016-01-23 MED ORDER — ONDANSETRON HCL 4 MG/2ML IJ SOLN
INTRAMUSCULAR | Status: DC | PRN
  Administered 2016-01-23: 4 mg via INTRAVENOUS

## 2016-01-23 MED ORDER — FLUORESCEIN SODIUM 10 % IV SOLN
INTRAVENOUS | Status: DC | PRN
  Administered 2016-01-23: 100 mg via INTRAVENOUS

## 2016-01-23 MED ORDER — ROCURONIUM BROMIDE 50 MG/5ML IV SOSY
PREFILLED_SYRINGE | INTRAVENOUS | Status: DC | PRN
  Administered 2016-01-23: 10 mg via INTRAVENOUS
  Administered 2016-01-23: 20 mg via INTRAVENOUS
  Administered 2016-01-23: 10 mg via INTRAVENOUS

## 2016-01-23 MED ORDER — KETOROLAC TROMETHAMINE 30 MG/ML IJ SOLN
INTRAMUSCULAR | Status: DC | PRN
  Administered 2016-01-23: 30 mg via INTRAVENOUS

## 2016-01-23 MED FILL — OXYCODONE-APAP 7.5/325MG: 7.5-325 | 5 days supply | Qty: 30 | Fill #0

## 2016-01-23 SURGICAL SUPPLY — 56 items
APPLICATOR COTTON TIP 6IN STRL (MISCELLANEOUS) ×4 IMPLANT
BAG RETRIEVAL 10 (BASKET) ×1
BAG RETRIEVAL 10MM (BASKET) ×1
BANDAGE ADHESIVE 1X3 (GAUZE/BANDAGES/DRESSINGS) IMPLANT
BLADE SURG 11 STRL SS (BLADE) ×4 IMPLANT
CANISTER SUCTION 1200CC (MISCELLANEOUS) IMPLANT
CANISTER SUCTION 2500CC (MISCELLANEOUS) IMPLANT
CATH ROBINSON RED A/P 16FR (CATHETERS) ×4 IMPLANT
COVER MAYO STAND STRL (DRAPES) ×8 IMPLANT
DRAPE UNDERBUTTOCKS STRL (DRAPE) ×4 IMPLANT
DRSG COVADERM PLUS 2X2 (GAUZE/BANDAGES/DRESSINGS) ×12 IMPLANT
ELECT REM PT RETURN 9FT ADLT (ELECTROSURGICAL) ×4
ELECTRODE REM PT RTRN 9FT ADLT (ELECTROSURGICAL) ×2 IMPLANT
FILTER SMOKE EVAC LAPAROSHD (FILTER) IMPLANT
GLOVE BIO SURGEON STRL SZ7 (GLOVE) ×8 IMPLANT
GLOVE BIOGEL PI IND STRL 7.0 (GLOVE) ×2 IMPLANT
GLOVE BIOGEL PI IND STRL 7.5 (GLOVE) ×2 IMPLANT
GLOVE BIOGEL PI INDICATOR 7.0 (GLOVE) ×2
GLOVE BIOGEL PI INDICATOR 7.5 (GLOVE) ×2
GLOVE ECLIPSE 7.0 STRL STRAW (GLOVE) ×4 IMPLANT
GOWN STRL REUS W/TWL XL LVL3 (GOWN DISPOSABLE) ×8 IMPLANT
KIT ROOM TURNOVER WOR (KITS) ×4 IMPLANT
LEGGING LITHOTOMY PAIR STRL (DRAPES) ×4 IMPLANT
LIQUID BAND (GAUZE/BANDAGES/DRESSINGS) ×4 IMPLANT
NEEDLE HYPO 25X1 1.5 SAFETY (NEEDLE) ×4 IMPLANT
NEEDLE INSUFFLATION 14GA 120MM (NEEDLE) IMPLANT
NS IRRIG 500ML POUR BTL (IV SOLUTION) ×4 IMPLANT
PACK BASIN DAY SURGERY FS (CUSTOM PROCEDURE TRAY) ×4 IMPLANT
PACK LAPAROSCOPY II (CUSTOM PROCEDURE TRAY) ×4 IMPLANT
PAD OB MATERNITY 4.3X12.25 (PERSONAL CARE ITEMS) ×4 IMPLANT
PAD PREP 24X48 CUFFED NSTRL (MISCELLANEOUS) ×4 IMPLANT
POUCH SPECIMEN RETRIEVAL 10MM (ENDOMECHANICALS) IMPLANT
SCISSORS LAP 5X35 DISP (ENDOMECHANICALS) IMPLANT
SCISSORS LAP 5X45 EPIX DISP (ENDOMECHANICALS) IMPLANT
SEALER TISSUE G2 CVD JAW 35 (ENDOMECHANICALS) IMPLANT
SEALER TISSUE G2 CVD JAW 45CM (ENDOMECHANICALS) ×4 IMPLANT
SET IRRIG TUBING LAPAROSCOPIC (IRRIGATION / IRRIGATOR) ×4 IMPLANT
SET IRRIG Y TYPE TUR BLADDER L (SET/KITS/TRAYS/PACK) ×4 IMPLANT
SOLUTION ANTI FOG 6CC (MISCELLANEOUS) ×4 IMPLANT
SUT VIC AB 3-0 PS2 18 (SUTURE) ×4
SUT VIC AB 3-0 PS2 18XBRD (SUTURE) ×4 IMPLANT
SUT VICRYL 0 ENDOLOOP (SUTURE) IMPLANT
SUT VICRYL 0 UR6 27IN ABS (SUTURE) ×4 IMPLANT
SUT VICRYL 4-0 PS2 18IN ABS (SUTURE) IMPLANT
SYR CONTROL 10ML LL (SYRINGE) ×4 IMPLANT
SYRINGE 10CC LL (SYRINGE) IMPLANT
SYS BAG RETRIEVAL 10MM (BASKET) ×2
SYSTEM BAG RETRIEVAL 10MM (BASKET) ×2 IMPLANT
TOWEL OR 17X24 6PK STRL BLUE (TOWEL DISPOSABLE) ×8 IMPLANT
TRAY DSU PREP LF (CUSTOM PROCEDURE TRAY) ×4 IMPLANT
TROCAR BALLN 12MMX100 BLUNT (TROCAR) ×4 IMPLANT
TROCAR OPTI TIP 5M 100M (ENDOMECHANICALS) ×8 IMPLANT
TROCAR XCEL DIL TIP R 11M (ENDOMECHANICALS) ×4 IMPLANT
TUBING INSUFFLATION 10FT LAP (TUBING) ×4 IMPLANT
WARMER LAPAROSCOPE (MISCELLANEOUS) ×4 IMPLANT
WATER STERILE IRR 500ML POUR (IV SOLUTION) ×4 IMPLANT

## 2016-01-23 NOTE — Anesthesia Procedure Notes (Signed)
Procedure Name: Intubation Date/Time: 01/23/2016 7:32 AM Performed by: Denna Haggard D Pre-anesthesia Checklist: Patient identified, Emergency Drugs available, Suction available and Patient being monitored Patient Re-evaluated:Patient Re-evaluated prior to inductionOxygen Delivery Method: Circle system utilized Preoxygenation: Pre-oxygenation with 100% oxygen Intubation Type: IV induction Ventilation: Mask ventilation without difficulty Laryngoscope Size: Mac and 3 Grade View: Grade I Tube type: Oral Tube size: 7.0 mm Number of attempts: 1 Airway Equipment and Method: Stylet,  LTA kit utilized and Oral airway Placement Confirmation: ETT inserted through vocal cords under direct vision,  positive ETCO2 and breath sounds checked- equal and bilateral Secured at: 22 cm Tube secured with: Tape Dental Injury: Teeth and Oropharynx as per pre-operative assessment

## 2016-01-23 NOTE — Anesthesia Postprocedure Evaluation (Signed)
Anesthesia Post Note  [facility]  Procedure(s) Performed: Procedure(s) (LRB): LAPAROSCOPY DIAGNOSTIC  RIGHT SALPING          OOPHORECTOMY (Right) CYSTOSCOPY (N/A)  Patient location during evaluation: PACU Anesthesia Type: General Level of consciousness: awake and alert Pain management: pain level controlled Vital Signs Assessment: post-procedure vital signs reviewed and stable Respiratory status: spontaneous breathing, nonlabored ventilation, respiratory function stable and patient connected to nasal cannula oxygen Cardiovascular status: blood pressure returned to baseline and stable Postop Assessment: no signs of nausea or vomiting Anesthetic complications: no    Last Vitals:  Vitals:   01/23/16 0907 01/23/16 0915  BP: 131/71 116/64  Pulse: 65 (!) 48  Resp: 12 10  Temp: 36.6 C     Last Pain:  Vitals:   01/23/16 0907  TempSrc:   PainSc: 5                  Kristee Angus S

## 2016-01-23 NOTE — Procedures (Signed)
Patient name Terri Mora, Terri Mora#[id number]#[id number] CSN# [id number]  [name],[name], MD 01/23/2016 9:04 AM

## 2016-01-23 NOTE — Transfer of Care (Signed)
Immediate Anesthesia Transfer of Care Note  [facility]  Procedure(s) Performed: Procedure(s) (LRB): LAPAROSCOPY DIAGNOSTIC  RIGHT SALPING          OOPHORECTOMY (Right) CYSTOSCOPY (N/A)  Patient Location: PACU  Anesthesia Type: General  Level of Consciousness: awake, oriented, sedated and patient cooperative  Airway & Oxygen Therapy: Patient Spontanous Breathing and Patient connected to face mask oxygen  Post-op Assessment: Report given to PACU RN and Post -op Vital signs reviewed and stable  Post vital signs: Reviewed and stable  Complications: No apparent anesthesia complications Last Vitals:  Vitals:   01/23/16 0601 01/23/16 0907  BP: 140/79 (P) 131/71  Pulse: 73   Resp: (!) 22   Temp: 36.8 C (P) 36.6 C

## 2016-01-23 NOTE — Anesthesia Preprocedure Evaluation (Signed)

## 2016-01-23 NOTE — Progress Notes (Signed)
Patient ID: Terri Mora, female   DOB: 10-30-1970, 45 y.o.   MRN: [id number] Call to evaluate swelling in left loser quad Area a little more swollen. But soft. Observed no change doubt hematoma Pressure dressing call with any change

## 2016-01-23 NOTE — H&P (Signed)
  History and physical exam unchanged 

## 2016-01-23 NOTE — Op Note (Signed)
NAME:  Terri Mora, Terri Mora           ACCOUNT NO.:  [id number]  MEDICAL RECORD NO.:  [id number]  LOCATION:                                 FACILITY:  PHYSICIAN:  Juluis MireJohn S. Marguarite Markov, M.D.   DATE OF BIRTH:  1970-12-11  DATE OF PROCEDURE:  01/23/2016 DATE OF DISCHARGE:                              OPERATIVE REPORT   PREOPERATIVE DIAGNOSIS:  Right lower quadrant pain with persistent cystic enlargement of the right ovary, past history of endometriosis.  POSTOPERATIVE DIAGNOSIS:  Right-sided endometrioma, pelvic endometriosis, and some adhesive processes.  OPERATIVE PROCEDURE:  Open laparoscopy, lysis of adhesions, right salpingo-oophorectomy, and cystoscopy.  SURGEON:  Juluis MireJohn S. Zoria Rawlinson, M.D.  ANESTHESIA:  General endotracheal.  ESTIMATED BLOOD LOSS:  Minimal.  PACKS AND DRAINS:  None.  INTRAOPERATIVE BLOOD PLACED:  None.  COMPLICATIONS:  None.  INDICATIONS:  As previously dictated.  PROCEDURE IN DETAIL:  The patient was taken to the OR and placed in supine position.  After satisfactory level of general endotracheal anesthesia was obtained, the patient was placed in dorsal lithotomy position using Allen stirrups.  The abdomen, perineum, and vagina were prepped out with Betadine.  Bladder was emptied by in and out catheterization.  A Hulka tenaculum was put in place and secured.  The patient was draped in sterile field.  Subumbilical incision made with a knife.  The incision was extended through the subcutaneous tissue.  The anterior rectus fascia was identified, entered sharply.  Peritoneum was entered with blunt finger pressure.  Open laparoscopic trocar was put in place and secured.  Abdomen was inflated with carbon dioxide. Visualization revealed no evidence of injury to adjacent organs.  A 5-mm trocar was put in place in suprapubic area under direct visualization. A second 5 mm trocar was put in place in the left lower quadrant after visualization of the epigastric vessels.  At  this point in time, the uterus was elevated.  She had an obvious endometrioma on the right ovary and right ovary was adherent to the right pelvic sidewall.  The endometrioma drained spontaneously.  This area was irrigated completely. The ovary was easily elevated up and she had minimal adhesions to the pelvic sidewall.  She had an adhesion from the epiploica of the colon to the posterior aspect of the uterus.  We were able to bluntly dissect this free.  We elevated the right ovary.  We identified the ureter on the right side.  Using the Enseal, the right ovarian vasculature was cauterized and incised.  The mesenteric attachments of the tube and ovary were cauterized, incised up to the utero-ovarian pedicle.  Utero- ovarian pedicle and tube attachment to the uterus were then cauterized and incised, and the uterus and tube were placed in the cul-de-sac.  We then brought in the Kleppinger.  We irrigated the pelvis.  We cauterized the area of the ovarian vasculature.  Areas of bleeding were also cauterized.  At this point in time, we had good hemostasis.  The suprapubic 5 mm trocar was removed, incision was extended, and a 10 mm trocar was put in place.  The endobag was inserted and the tube and ovary were inserted in the endobag.  It was brought out through  the suprapubic incision.  We had to remove it in pieces using Kocher. Eventually entire tube and ovary were removed and all sent for Pathology.  The 5 mm trocars were replaced.  Visualization of the pelvis revealed good hemostasis.  We thoroughly irrigated the pelvis, removed all irrigation.  We de-inflated the abdomen and reinflated.  There was no bleeding from the right pelvic sidewall where the tube and ovary had been removed.  At this point in time, the abdomen was completely de- inflated with carbon dioxide.  All trocars were removed.  The fascia in the subumbilical incision was closed with figure-of-eight of 0 Vicryl. Skin was  closed with interrupted subcuticulars of 3-0 Vicryl. Suprapubic incision was closed with interrupted subcuticulars of 3-0 Vicryl.  The left lower quadrant incision was closed with Dermabond.  We did put Dermabond on the 2 other incisions.  At this point in time, the tenaculum was removed from the cervix.  A cystoscope was brought in place, inserted in the bladder.  Bladder was filled with saline.  Visualization did reveal jets of urine coming from the right ureter.  Also from the left ureter, there was no evidence of injury to the bladder.  The cystoscope was removed and bladder was drained.  The patient was taken out of the dorsal lithotomy position. Once alert and extubated, transferred to the recovery room in good condition.  Sponge, instrument, and needle counts were correct by circulating nurse x2.  Urine was clear at the time of closure.     Juluis Mire, M.D.   ______________________________ Juluis Mire, M.D.    JSM/MEDQ  D:  01/23/2016  T:  01/23/2016  Job:  [id number]

## 2016-01-23 NOTE — Discharge Instructions (Signed)

## 2016-01-23 NOTE — Progress Notes (Signed)
Late entry After ambulating to bathroom at 1140,left lower abdomen at left port site across to midline lower abdomen swelling noted-slight firmness present-asymetrical compared to rt lower abdomen.Denied any increase in pain-tender to palpation, no increase in bleeding at port sites.  Ice pack in place- call into Dr Arelia SneddonMcComb.

## 2016-01-26 ENCOUNTER — Encounter (HOSPITAL_BASED_OUTPATIENT_CLINIC_OR_DEPARTMENT_OTHER): Payer: Self-pay | Admitting: Obstetrics and Gynecology

## 2016-12-29 IMAGING — US US SOFT TISSUE HEAD/NECK
1 series · 13 of 25 positions shown · non-contrast
Comparison: Neck MRI - 12/10/2012

CLINICAL DATA: New right-sided tender thyromegaly versus cervical
adenopathy for the past week.

EXAM:
THYROID ULTRASOUND
TECHNIQUE: Ultrasound examination of the thyroid gland and adjacent soft
tissues was performed.

[Series 1: us soft tissue head/neck · 0.07mm/px · 13 of 64 slices shown]
[im 1/64]
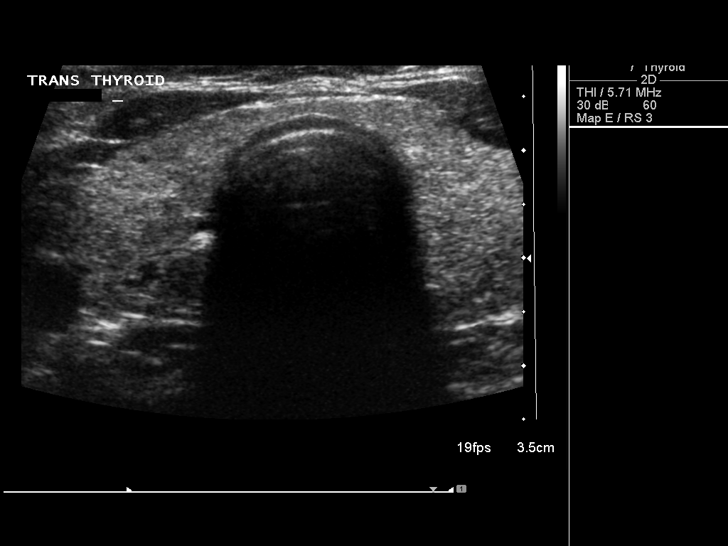
[im 6/64]
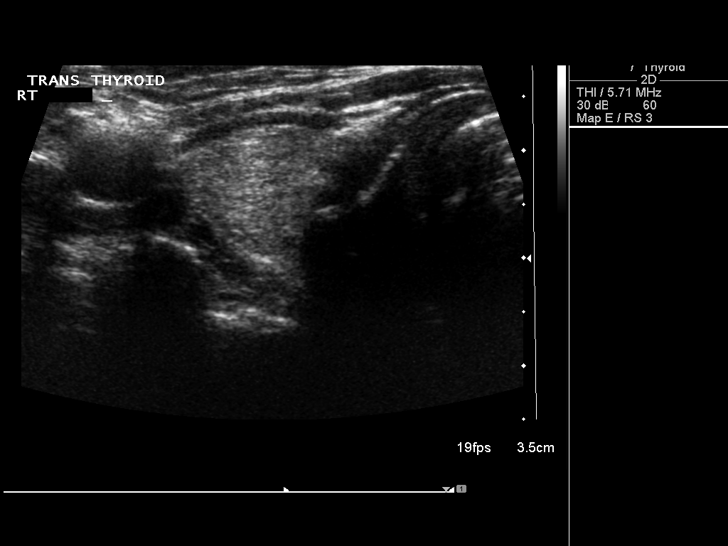
[im 11/64]
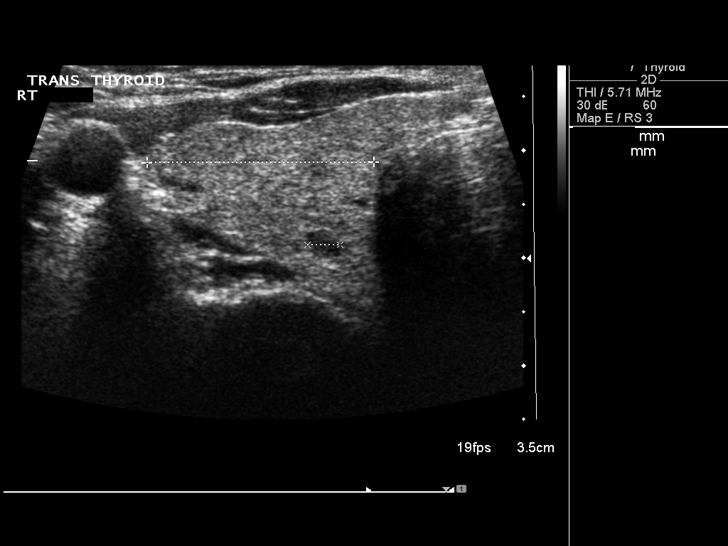
[im 16/64]
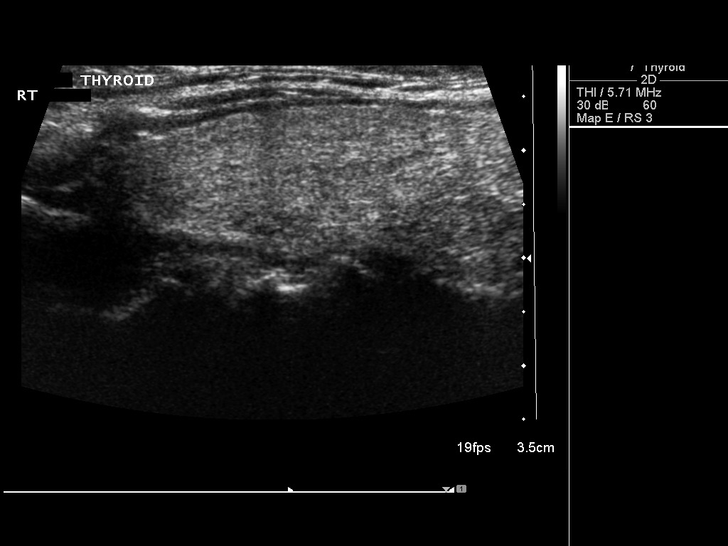
[im 22/64]
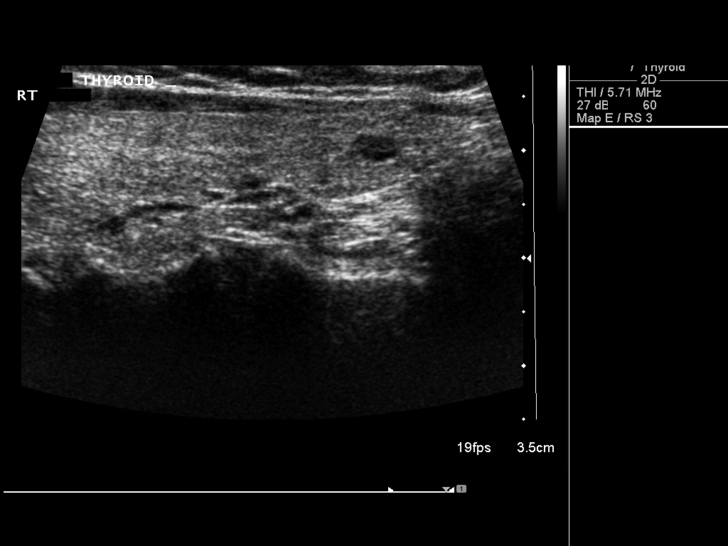
[im 27/64]
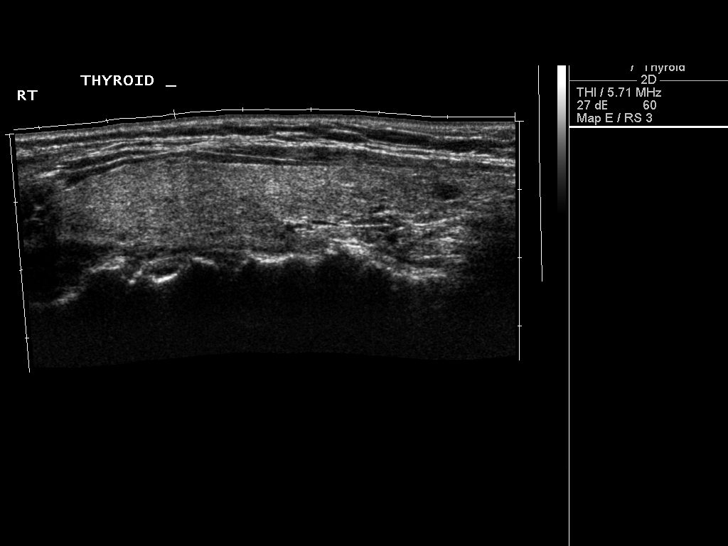
[im 32/64]
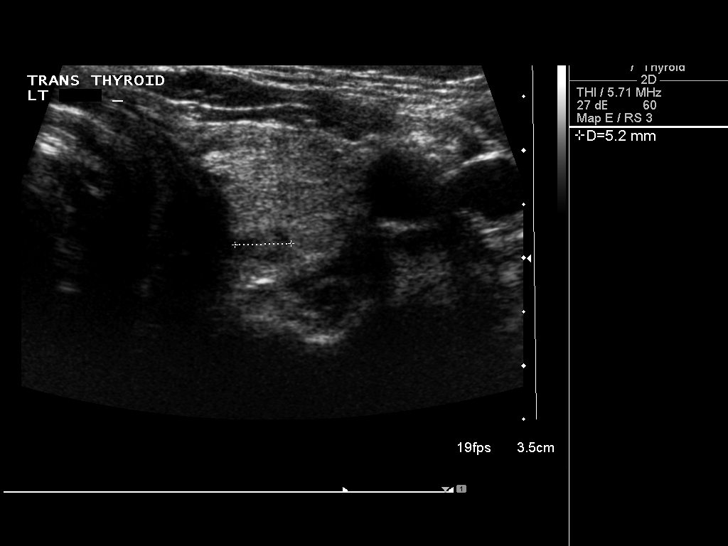
[im 37/64]
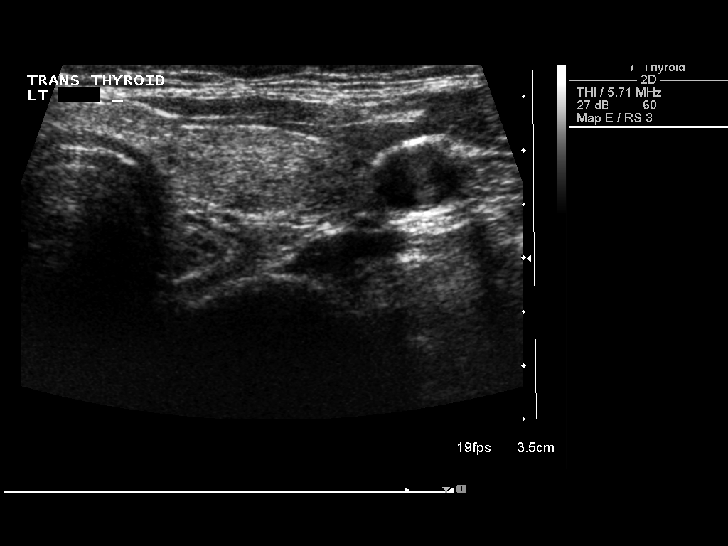
[im 43/64]
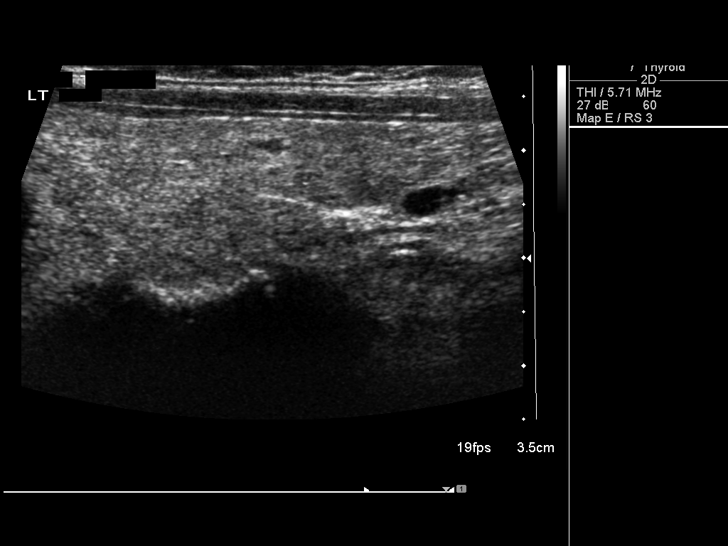
[im 48/64]
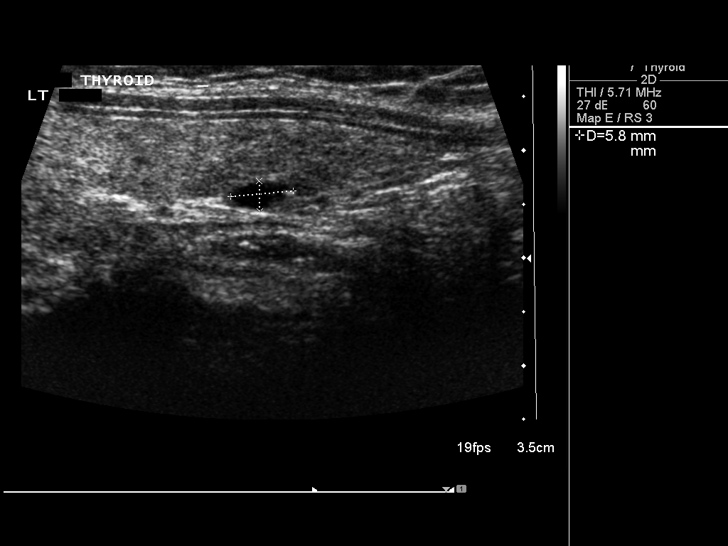
[im 53/64]
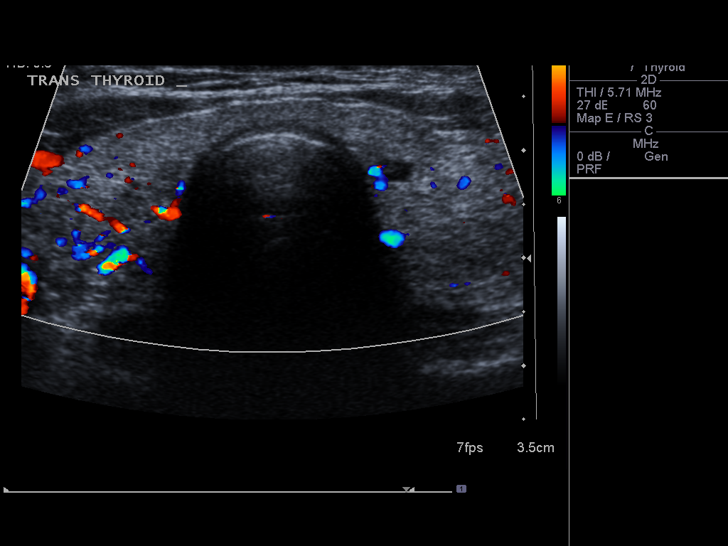
[im 58/64]
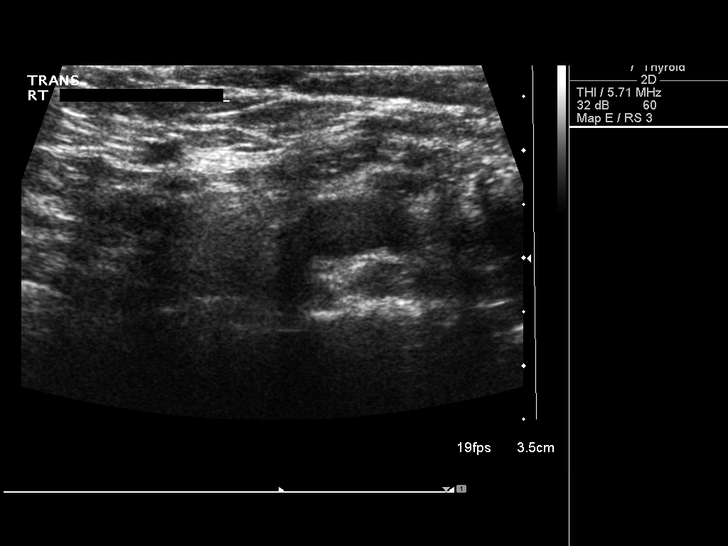
[im 64/64]
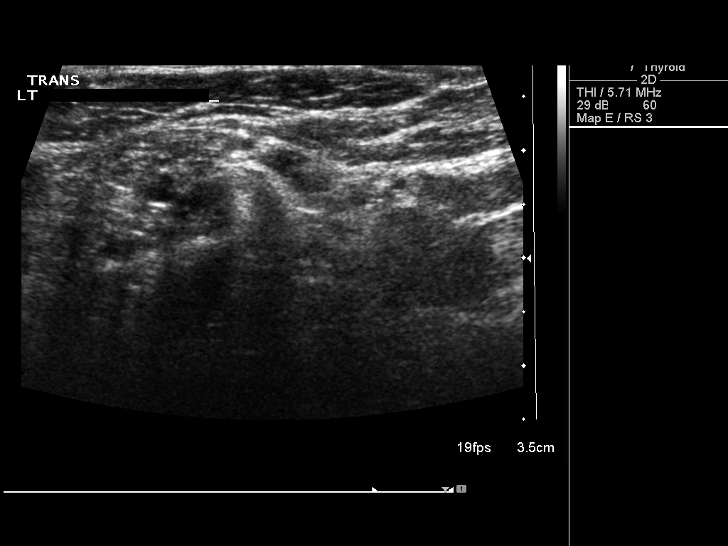

[13 of 25 positions shown; findings below may reference images not displayed]

FINDINGS: Right thyroid lobe

Measurements: Normal in size measuring 6.9 x 1.3 x 2.1 cm.

There are several scattered punctate (sub 5 mm) anechoic and thus
presumably cystic nodules scattered throughout the right lobe of the
thyroid.

Left thyroid lobe

Measurements: Normal in size measuring 6.6 x 1.6 x 2.1 cm..

There are several scattered punctate (sub 5 mm) anechoic and thus
presumably cystic nodules scattered within the left lobe of the
thyroid.

Isthmus

Thickness: Normal in size measuring 0.3 cm in diameter.

No discrete nodule or mass is identified within the thyroid isthmus.

Lymphadenopathy

None visualized.
IMPRESSION: 1. No explanation for patient's right-sided neck pain. Specifically,
no evidence of asymmetric thyromegaly or discrete thyroid mass.
2. Scattered punctate (sub 5 mm) cystic nodules within the right and
left lobes of the thyroid do not currently meet imaging criteria to
recommend percutaneous sampling. This recommendation follows the
consensus statement: Management of Thyroid Nodules Detected at US:
Society of [employer] in Ultrasound Consensus Conference

## 2016-12-30 IMAGING — CR DG CERVICAL SPINE 2 OR 3 VIEWS
3 series · 3 of 3 positions shown · non-contrast
Comparison: None.

CLINICAL DATA: Right neck pain.  No known injury.

EXAM:
CERVICAL SPINE - 2-3 VIEW

[lateral]
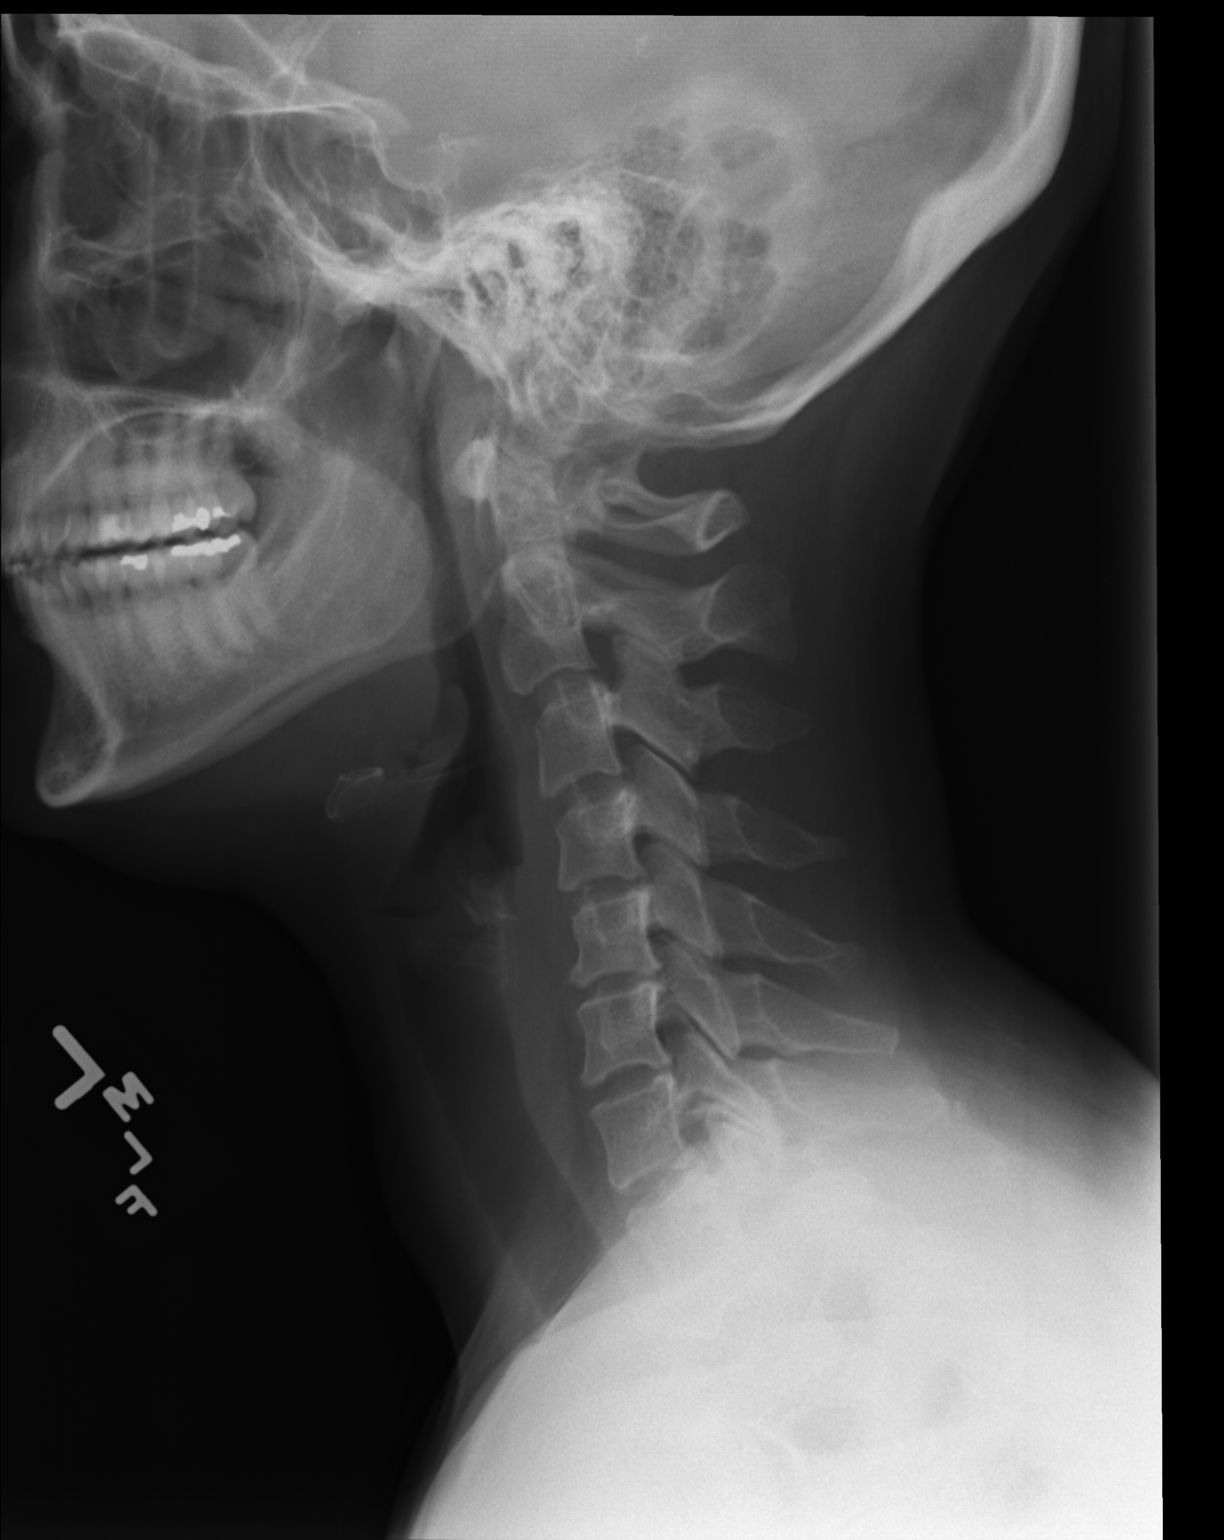

[AP (1 of 2)]
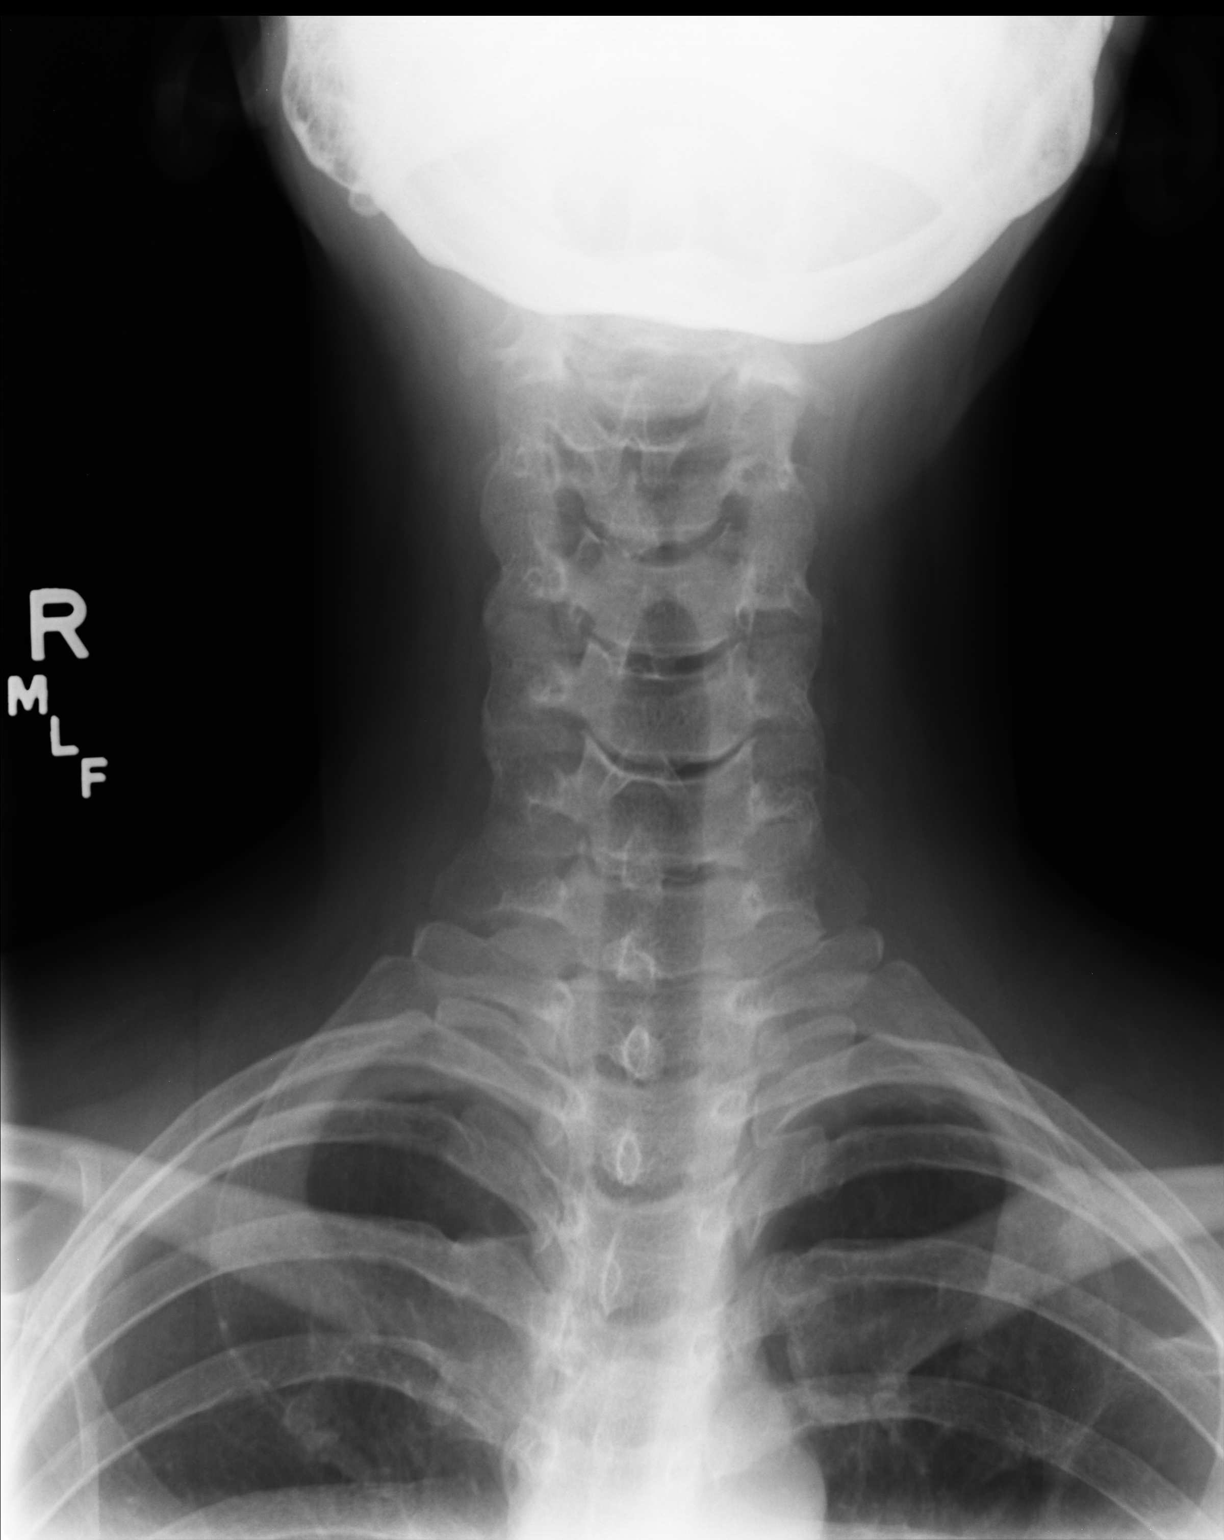

[AP (2 of 2)]
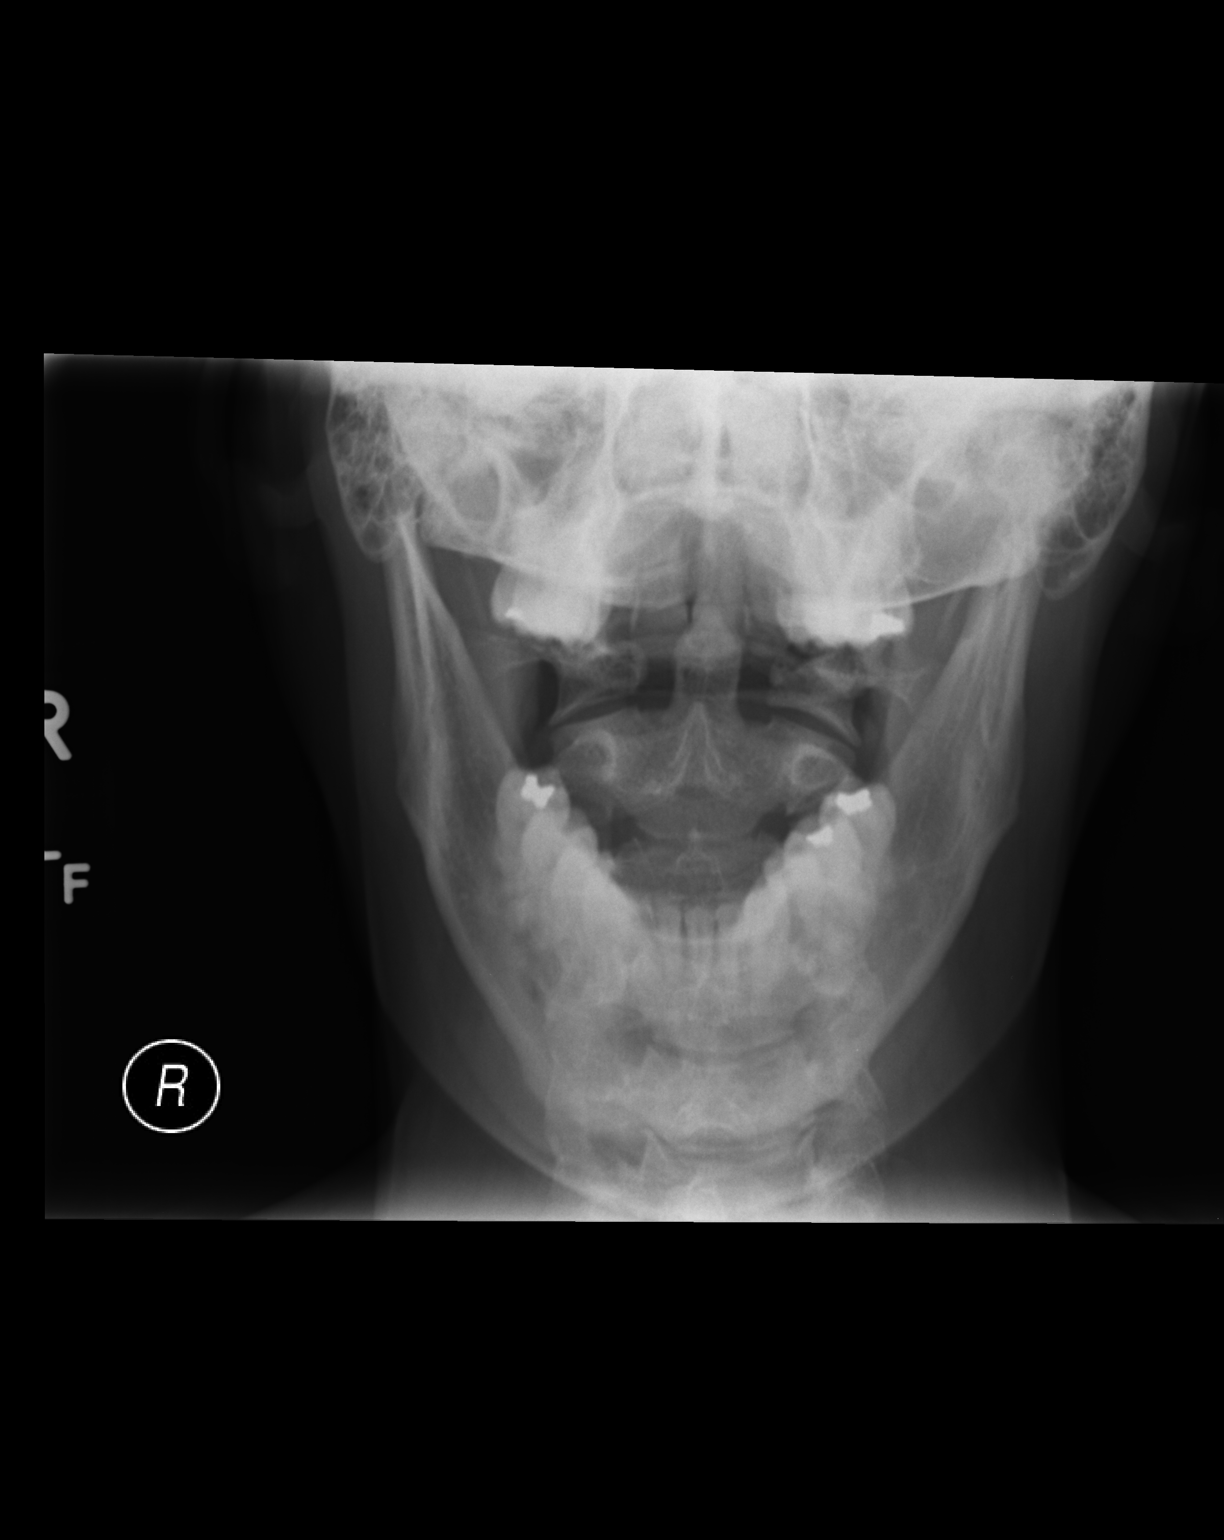

[3 of 3 positions shown; findings below may reference images not displayed]

FINDINGS: Loss of normal cervical lordosis. No subluxation or fracture.
Prevertebral soft tissues are normal. Disc spaces are maintained.
IMPRESSION: Cervical straightening.  No acute bony abnormality.
